# Patient Record
Sex: Female | Born: 1935 | Race: White | Hispanic: No | Smoking: Never smoker
Health system: Southern US, Community
[De-identification: ages and names within clinical notes are randomized; demographics above are authoritative.]

## PROBLEM LIST (undated history)

## (undated) DIAGNOSIS — I428 Other cardiomyopathies: Secondary | ICD-10-CM

## (undated) DIAGNOSIS — C50919 Malignant neoplasm of unspecified site of unspecified female breast: Secondary | ICD-10-CM

## (undated) DIAGNOSIS — G4733 Obstructive sleep apnea (adult) (pediatric): Secondary | ICD-10-CM

## (undated) DIAGNOSIS — E039 Hypothyroidism, unspecified: Secondary | ICD-10-CM

## (undated) DIAGNOSIS — I10 Essential (primary) hypertension: Secondary | ICD-10-CM

## (undated) DIAGNOSIS — E119 Type 2 diabetes mellitus without complications: Secondary | ICD-10-CM

## (undated) DIAGNOSIS — N289 Disorder of kidney and ureter, unspecified: Secondary | ICD-10-CM

## (undated) DIAGNOSIS — I4821 Permanent atrial fibrillation: Secondary | ICD-10-CM

## (undated) HISTORY — DX: Disorder of kidney and ureter, unspecified: N28.9

## (undated) HISTORY — DX: Other cardiomyopathies: I42.8

## (undated) HISTORY — DX: Obstructive sleep apnea (adult) (pediatric): G47.33

## (undated) HISTORY — DX: Hypothyroidism, unspecified: E03.9

## (undated) HISTORY — DX: Essential (primary) hypertension: I10

## (undated) HISTORY — DX: Permanent atrial fibrillation: I48.21

## (undated) HISTORY — DX: Type 2 diabetes mellitus without complications: E11.9

## (undated) HISTORY — PX: PACEMAKER PLACEMENT: SHX43

## (undated) HISTORY — DX: Malignant neoplasm of unspecified site of unspecified female breast: C50.919

---

## 2003-02-26 ENCOUNTER — Ambulatory Visit (HOSPITAL_COMMUNITY): Admission: RE | Admit: 2003-02-26 | Discharge: 2003-02-26 | Payer: Self-pay | Admitting: Cardiology

## 2004-08-10 ENCOUNTER — Ambulatory Visit: Payer: Self-pay | Admitting: Cardiology

## 2004-08-18 ENCOUNTER — Ambulatory Visit: Payer: Self-pay | Admitting: Cardiology

## 2004-10-24 ENCOUNTER — Ambulatory Visit: Payer: Self-pay | Admitting: Cardiology

## 2005-03-15 ENCOUNTER — Ambulatory Visit: Payer: Self-pay | Admitting: Cardiology

## 2005-03-17 ENCOUNTER — Ambulatory Visit: Payer: Self-pay | Admitting: Cardiology

## 2005-03-30 ENCOUNTER — Ambulatory Visit: Payer: Self-pay | Admitting: Cardiology

## 2005-04-28 ENCOUNTER — Ambulatory Visit: Payer: Self-pay | Admitting: Cardiology

## 2006-03-01 ENCOUNTER — Ambulatory Visit: Payer: Self-pay | Admitting: Cardiology

## 2006-03-06 ENCOUNTER — Ambulatory Visit: Payer: Self-pay | Admitting: Cardiology

## 2006-03-06 ENCOUNTER — Inpatient Hospital Stay (HOSPITAL_COMMUNITY): Admission: AD | Admit: 2006-03-06 | Discharge: 2006-03-14 | Payer: Self-pay | Admitting: Cardiology

## 2006-03-08 ENCOUNTER — Encounter: Payer: Self-pay | Admitting: Cardiology

## 2006-03-08 ENCOUNTER — Ambulatory Visit: Payer: Self-pay | Admitting: Internal Medicine

## 2006-03-28 ENCOUNTER — Ambulatory Visit: Payer: Self-pay

## 2006-04-06 ENCOUNTER — Ambulatory Visit: Payer: Self-pay | Admitting: Cardiology

## 2006-04-23 ENCOUNTER — Ambulatory Visit: Payer: Self-pay | Admitting: Cardiology

## 2006-06-19 ENCOUNTER — Ambulatory Visit: Payer: Self-pay | Admitting: Internal Medicine

## 2006-08-09 ENCOUNTER — Ambulatory Visit: Payer: Self-pay | Admitting: Cardiology

## 2006-08-14 ENCOUNTER — Ambulatory Visit: Payer: Self-pay | Admitting: Internal Medicine

## 2006-08-20 ENCOUNTER — Ambulatory Visit: Payer: Self-pay | Admitting: Cardiology

## 2006-08-23 ENCOUNTER — Ambulatory Visit: Payer: Self-pay | Admitting: Cardiology

## 2006-08-28 ENCOUNTER — Inpatient Hospital Stay (HOSPITAL_BASED_OUTPATIENT_CLINIC_OR_DEPARTMENT_OTHER): Admission: RE | Admit: 2006-08-28 | Discharge: 2006-08-28 | Payer: Self-pay | Admitting: Cardiology

## 2006-08-28 ENCOUNTER — Ambulatory Visit: Payer: Self-pay | Admitting: Cardiology

## 2006-08-31 ENCOUNTER — Ambulatory Visit: Payer: Self-pay | Admitting: Cardiology

## 2006-09-03 ENCOUNTER — Ambulatory Visit: Payer: Self-pay | Admitting: Cardiology

## 2006-09-06 ENCOUNTER — Ambulatory Visit: Payer: Self-pay | Admitting: Cardiology

## 2006-09-07 ENCOUNTER — Ambulatory Visit: Payer: Self-pay | Admitting: Cardiology

## 2006-09-13 ENCOUNTER — Ambulatory Visit: Payer: Self-pay | Admitting: Cardiology

## 2006-10-01 ENCOUNTER — Ambulatory Visit: Payer: Self-pay | Admitting: Cardiology

## 2006-10-05 ENCOUNTER — Ambulatory Visit: Payer: Self-pay | Admitting: Cardiology

## 2006-11-06 ENCOUNTER — Ambulatory Visit: Payer: Self-pay | Admitting: Cardiology

## 2006-11-19 ENCOUNTER — Ambulatory Visit: Payer: Self-pay | Admitting: Cardiology

## 2007-02-01 ENCOUNTER — Ambulatory Visit: Payer: Self-pay | Admitting: Cardiology

## 2007-03-22 ENCOUNTER — Ambulatory Visit: Payer: Self-pay | Admitting: Internal Medicine

## 2007-03-27 ENCOUNTER — Ambulatory Visit: Payer: Self-pay | Admitting: Cardiology

## 2007-03-28 ENCOUNTER — Inpatient Hospital Stay (HOSPITAL_COMMUNITY): Admission: AD | Admit: 2007-03-28 | Discharge: 2007-03-30 | Payer: Self-pay | Admitting: Cardiology

## 2007-03-28 ENCOUNTER — Ambulatory Visit: Payer: Self-pay | Admitting: Cardiology

## 2007-04-02 ENCOUNTER — Ambulatory Visit: Payer: Self-pay | Admitting: Cardiology

## 2007-04-05 ENCOUNTER — Ambulatory Visit: Payer: Self-pay | Admitting: Internal Medicine

## 2007-04-09 ENCOUNTER — Ambulatory Visit: Payer: Self-pay | Admitting: Cardiology

## 2007-04-22 ENCOUNTER — Ambulatory Visit: Payer: Self-pay | Admitting: Cardiology

## 2007-05-07 ENCOUNTER — Ambulatory Visit: Payer: Self-pay | Admitting: Cardiology

## 2007-05-22 ENCOUNTER — Ambulatory Visit: Payer: Self-pay | Admitting: Internal Medicine

## 2007-06-04 ENCOUNTER — Ambulatory Visit: Payer: Self-pay | Admitting: Cardiology

## 2007-06-05 ENCOUNTER — Ambulatory Visit: Payer: Self-pay | Admitting: Cardiology

## 2007-06-18 ENCOUNTER — Ambulatory Visit: Payer: Self-pay | Admitting: Cardiology

## 2007-07-01 ENCOUNTER — Ambulatory Visit: Payer: Self-pay | Admitting: Cardiology

## 2007-07-03 ENCOUNTER — Ambulatory Visit: Payer: Self-pay | Admitting: Cardiology

## 2007-07-09 ENCOUNTER — Ambulatory Visit: Payer: Self-pay | Admitting: Cardiology

## 2007-07-16 ENCOUNTER — Ambulatory Visit: Payer: Self-pay | Admitting: Cardiology

## 2007-07-17 ENCOUNTER — Ambulatory Visit: Payer: Self-pay | Admitting: Internal Medicine

## 2007-07-19 ENCOUNTER — Ambulatory Visit: Payer: Self-pay | Admitting: Internal Medicine

## 2007-07-24 ENCOUNTER — Ambulatory Visit: Payer: Self-pay | Admitting: Cardiology

## 2007-08-20 ENCOUNTER — Encounter: Payer: Self-pay | Admitting: Cardiology

## 2007-08-22 ENCOUNTER — Encounter (INDEPENDENT_AMBULATORY_CARE_PROVIDER_SITE_OTHER): Payer: Self-pay | Admitting: Surgery

## 2007-08-22 ENCOUNTER — Ambulatory Visit (HOSPITAL_COMMUNITY): Admission: RE | Admit: 2007-08-22 | Discharge: 2007-08-23 | Payer: Self-pay | Admitting: Surgery

## 2007-09-02 ENCOUNTER — Ambulatory Visit: Payer: Self-pay | Admitting: Cardiology

## 2007-09-11 ENCOUNTER — Ambulatory Visit: Payer: Self-pay | Admitting: Cardiology

## 2007-09-18 ENCOUNTER — Ambulatory Visit: Payer: Self-pay | Admitting: Cardiology

## 2007-10-16 ENCOUNTER — Ambulatory Visit: Payer: Self-pay | Admitting: Internal Medicine

## 2007-11-05 ENCOUNTER — Ambulatory Visit: Payer: Self-pay | Admitting: Cardiology

## 2007-11-13 ENCOUNTER — Ambulatory Visit: Payer: Self-pay | Admitting: Cardiology

## 2008-01-15 ENCOUNTER — Ambulatory Visit: Payer: Self-pay | Admitting: Internal Medicine

## 2008-02-11 ENCOUNTER — Encounter: Payer: Self-pay | Admitting: Cardiology

## 2008-03-06 ENCOUNTER — Ambulatory Visit: Payer: Self-pay | Admitting: Internal Medicine

## 2008-04-15 ENCOUNTER — Ambulatory Visit: Payer: Self-pay | Admitting: Internal Medicine

## 2008-05-19 ENCOUNTER — Encounter: Payer: Self-pay | Admitting: Physician Assistant

## 2008-05-26 ENCOUNTER — Ambulatory Visit: Payer: Self-pay | Admitting: Cardiology

## 2008-07-16 ENCOUNTER — Ambulatory Visit: Payer: Self-pay | Admitting: Internal Medicine

## 2008-07-22 ENCOUNTER — Encounter: Payer: Self-pay | Admitting: Cardiology

## 2008-08-19 ENCOUNTER — Ambulatory Visit: Payer: Self-pay | Admitting: Internal Medicine

## 2008-10-15 ENCOUNTER — Ambulatory Visit: Payer: Self-pay | Admitting: Internal Medicine

## 2008-11-12 ENCOUNTER — Encounter: Payer: Self-pay | Admitting: Cardiology

## 2008-12-19 DIAGNOSIS — E785 Hyperlipidemia, unspecified: Secondary | ICD-10-CM | POA: Insufficient documentation

## 2008-12-19 DIAGNOSIS — G4733 Obstructive sleep apnea (adult) (pediatric): Secondary | ICD-10-CM | POA: Insufficient documentation

## 2008-12-19 DIAGNOSIS — I4821 Permanent atrial fibrillation: Secondary | ICD-10-CM | POA: Insufficient documentation

## 2008-12-19 DIAGNOSIS — N189 Chronic kidney disease, unspecified: Secondary | ICD-10-CM | POA: Insufficient documentation

## 2008-12-19 DIAGNOSIS — J984 Other disorders of lung: Secondary | ICD-10-CM | POA: Insufficient documentation

## 2008-12-19 DIAGNOSIS — I10 Essential (primary) hypertension: Secondary | ICD-10-CM | POA: Insufficient documentation

## 2008-12-19 DIAGNOSIS — I442 Atrioventricular block, complete: Secondary | ICD-10-CM | POA: Insufficient documentation

## 2008-12-19 DIAGNOSIS — E119 Type 2 diabetes mellitus without complications: Secondary | ICD-10-CM | POA: Insufficient documentation

## 2008-12-21 ENCOUNTER — Ambulatory Visit: Payer: Self-pay | Admitting: Cardiology

## 2009-01-16 ENCOUNTER — Encounter: Payer: Self-pay | Admitting: Internal Medicine

## 2009-01-16 ENCOUNTER — Ambulatory Visit: Payer: Self-pay | Admitting: Internal Medicine

## 2009-04-18 ENCOUNTER — Ambulatory Visit: Payer: Self-pay | Admitting: Internal Medicine

## 2009-05-05 ENCOUNTER — Encounter: Payer: Self-pay | Admitting: Internal Medicine

## 2009-05-11 ENCOUNTER — Encounter: Payer: Self-pay | Admitting: Cardiology

## 2009-05-21 ENCOUNTER — Telehealth (INDEPENDENT_AMBULATORY_CARE_PROVIDER_SITE_OTHER): Payer: Self-pay | Admitting: *Deleted

## 2009-05-26 ENCOUNTER — Ambulatory Visit: Payer: Self-pay | Admitting: Cardiology

## 2009-05-26 DIAGNOSIS — I428 Other cardiomyopathies: Secondary | ICD-10-CM | POA: Insufficient documentation

## 2009-05-26 DIAGNOSIS — I2510 Atherosclerotic heart disease of native coronary artery without angina pectoris: Secondary | ICD-10-CM | POA: Insufficient documentation

## 2009-05-28 ENCOUNTER — Ambulatory Visit: Payer: Self-pay | Admitting: Cardiology

## 2009-05-28 ENCOUNTER — Encounter: Payer: Self-pay | Admitting: Cardiology

## 2009-07-14 ENCOUNTER — Ambulatory Visit: Payer: Self-pay | Admitting: Internal Medicine

## 2009-10-04 ENCOUNTER — Ambulatory Visit: Payer: Self-pay | Admitting: Cardiology

## 2009-10-19 ENCOUNTER — Ambulatory Visit: Payer: Self-pay | Admitting: Internal Medicine

## 2009-10-20 ENCOUNTER — Encounter: Payer: Self-pay | Admitting: Cardiology

## 2009-12-16 ENCOUNTER — Telehealth (INDEPENDENT_AMBULATORY_CARE_PROVIDER_SITE_OTHER): Payer: Self-pay | Admitting: *Deleted

## 2009-12-27 ENCOUNTER — Inpatient Hospital Stay (HOSPITAL_COMMUNITY): Admission: RE | Admit: 2009-12-27 | Discharge: 2009-12-30 | Payer: Self-pay | Admitting: Orthopedic Surgery

## 2009-12-29 ENCOUNTER — Telehealth (INDEPENDENT_AMBULATORY_CARE_PROVIDER_SITE_OTHER): Payer: Self-pay | Admitting: *Deleted

## 2010-01-07 ENCOUNTER — Ambulatory Visit: Payer: Self-pay | Admitting: Internal Medicine

## 2010-01-09 IMAGING — CR DG CHEST 2V
2 series · 2 of 2 positions shown · non-contrast
Comparison: 03/08/06.

CLINICAL DATA: Preoperative respiratory exam for right mastectomy.  Pacemaker.  
 CHEST ? 2 VIEW:

[view not recorded (1 of 2)]
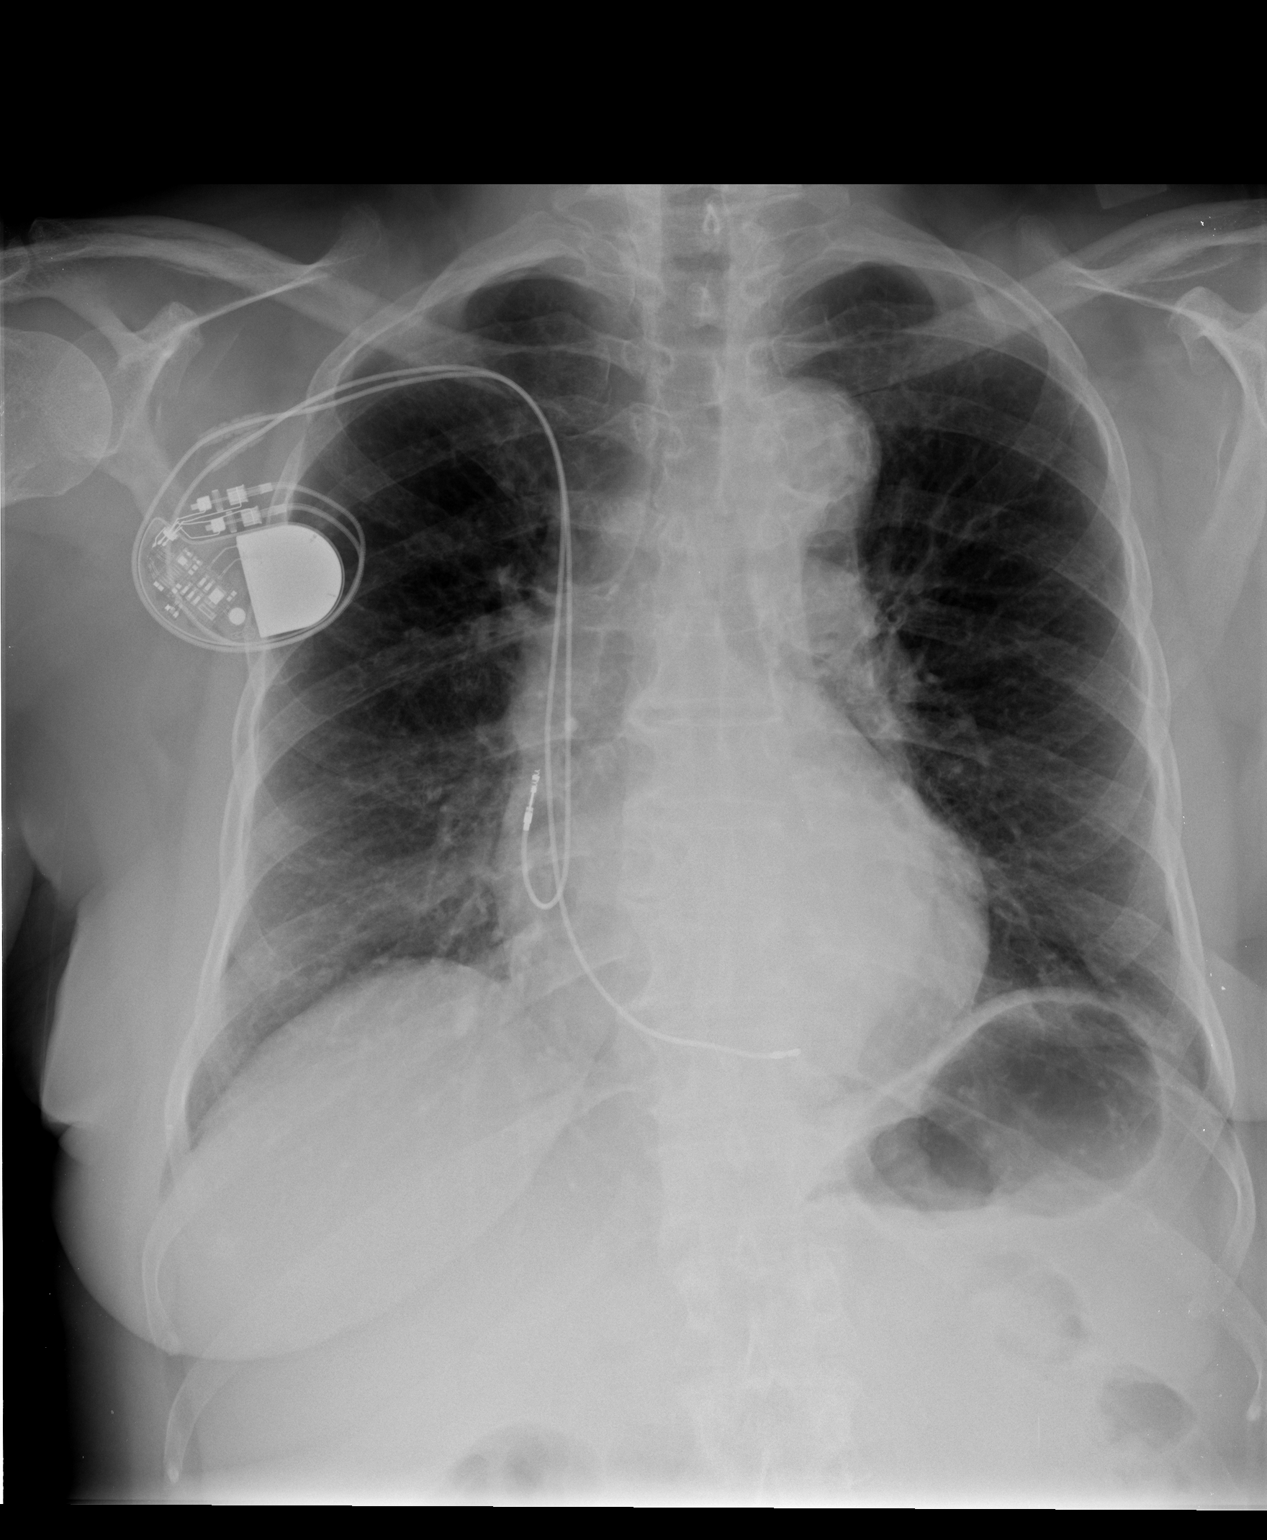

[view not recorded (2 of 2)]
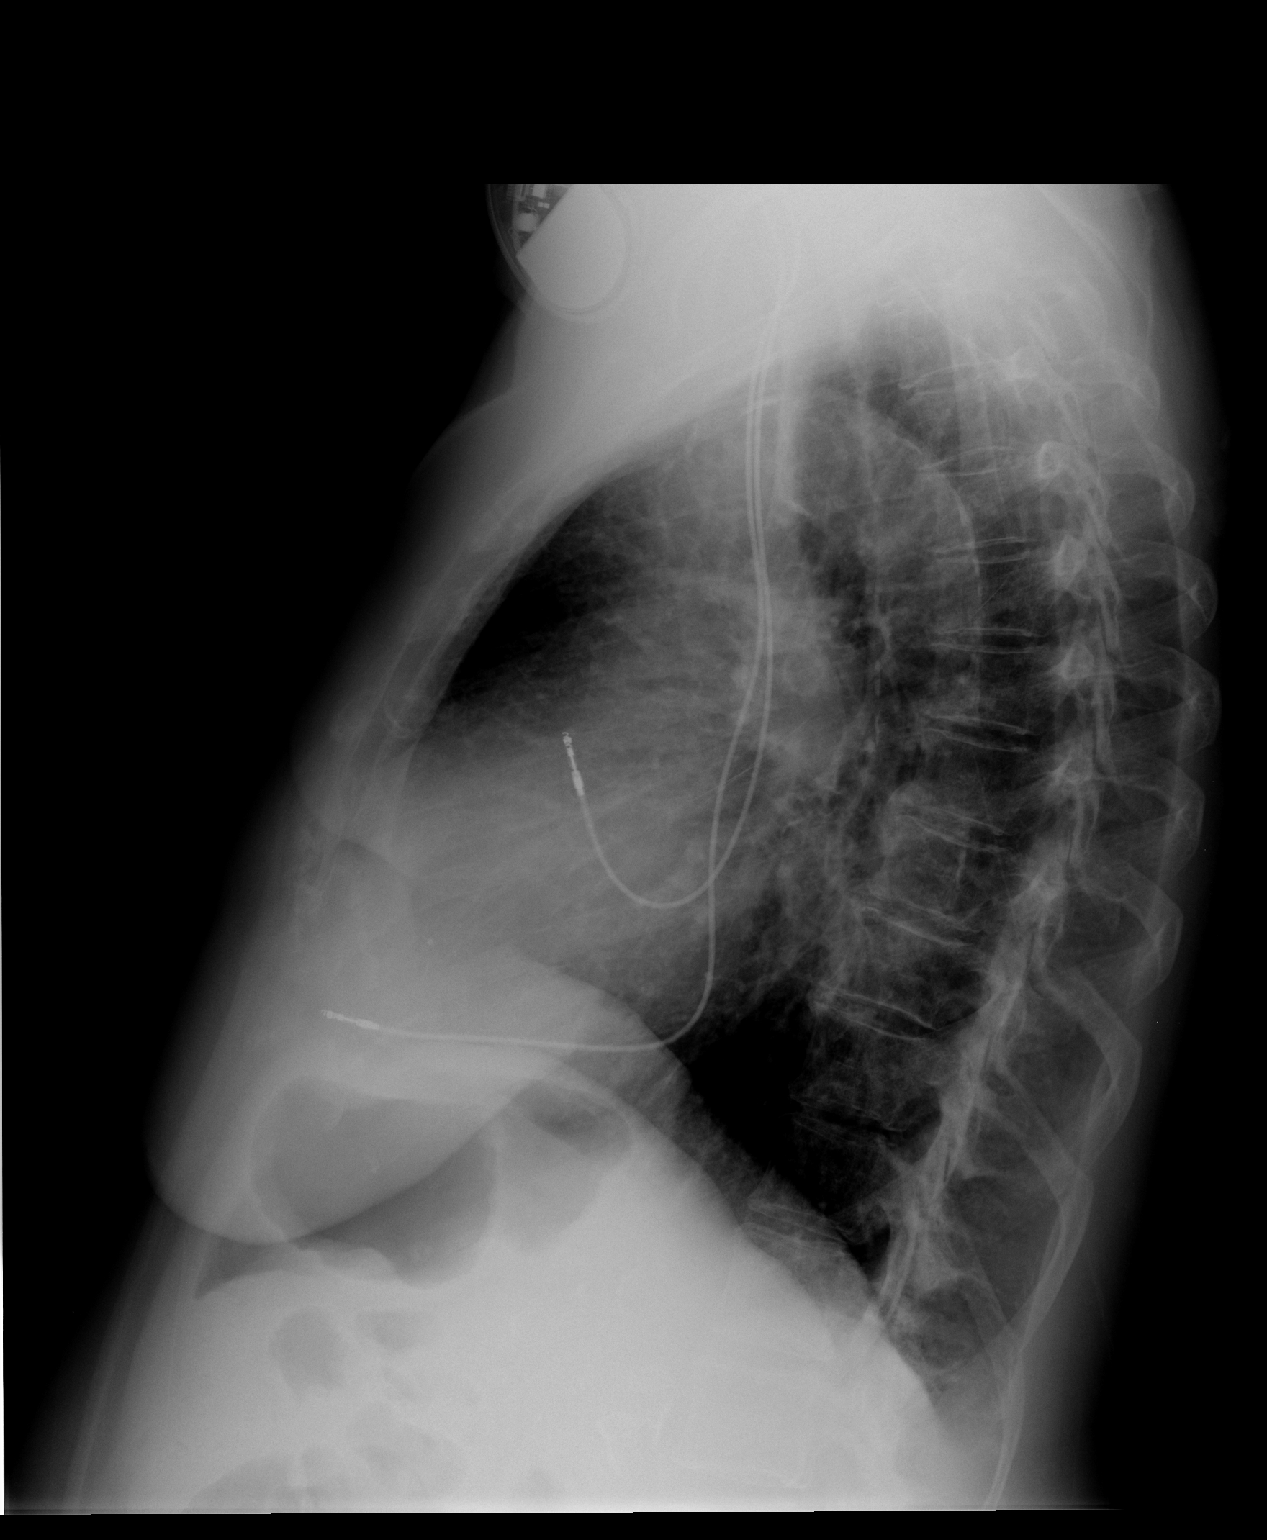

[2 of 2 positions shown; findings below may reference images not displayed]

FINDINGS: The patient has a dual lead pacemaker with leads in the region of the right atrium and right ventricle.  Heart size is normal.  There is calcification and unfolding of the thoracic aorta.  Vascularity is normal.  The lungs are clear.  No effusions.  Ordinary degenerative changes affect the spine.
IMPRESSION: No active disease.

## 2010-01-18 ENCOUNTER — Telehealth (INDEPENDENT_AMBULATORY_CARE_PROVIDER_SITE_OTHER): Payer: Self-pay | Admitting: *Deleted

## 2010-01-31 ENCOUNTER — Encounter (INDEPENDENT_AMBULATORY_CARE_PROVIDER_SITE_OTHER): Payer: Self-pay | Admitting: Critical Care Medicine

## 2010-01-31 ENCOUNTER — Encounter: Payer: Self-pay | Admitting: Physician Assistant

## 2010-01-31 ENCOUNTER — Encounter: Payer: Self-pay | Admitting: Cardiology

## 2010-02-01 ENCOUNTER — Ambulatory Visit: Payer: Self-pay | Admitting: Cardiology

## 2010-02-15 ENCOUNTER — Encounter: Payer: Self-pay | Admitting: Cardiology

## 2010-02-16 ENCOUNTER — Encounter: Payer: Self-pay | Admitting: Cardiology

## 2010-04-17 ENCOUNTER — Ambulatory Visit: Payer: Self-pay | Admitting: Internal Medicine

## 2010-06-24 ENCOUNTER — Ambulatory Visit: Payer: Self-pay | Admitting: Cardiology

## 2010-07-23 ENCOUNTER — Encounter: Payer: Self-pay | Admitting: Internal Medicine

## 2010-08-02 ENCOUNTER — Ambulatory Visit: Payer: Self-pay | Admitting: Internal Medicine

## 2010-08-09 NOTE — Cardiovascular Report (Signed)
Summary: Card Device [facility]/ [facility]  Card Device [facility]/ [facility] SUMMARY   Imported By: Dorise Hiss 07/19/2009 16:22:43  _____________________________________________________________________  External Attachment:    Type:   Image     Comment:   External Document

## 2010-08-09 NOTE — Assessment & Plan Note (Signed)
Summary: Per Dr. Elmarie Mainland needs to have pre op  Medications Added FISH OIL 1000 MG CAPS (OMEGA-3 FATTY ACIDS) Take 2 tablet by mouth two times a day LASIX 20 MG TABS (FUROSEMIDE) Take 1/2 tablet by mouth once a day        Visit Type:  Follow-up Primary Provider:  Kirstie Peri  CC:  follow-up visit and need clearance for knee surgery.  History of Present Illness: the patient is a 77 year old female with a history of nonischemic cardiomyopathy. The patient has an ejection fraction of 40-45%. She has permanent atrial fibrillation. She presents for followup. It sounds like it pacemaker adjustment not to long ago. She reports no symptoms of shortness of breath orthopnea PND. She has no palpitations or syncope. The patient also seen for preoperative evaluation. She reports planning to undergo knee surgery. She denies any chest pain. She has no cardiovascular complaints.   Preventive Screening-Counseling & Management  Alcohol-Tobacco     Smoking Status: never  Current Problems (verified): 1)  Left Ventricular Function, Decreased  (ICD-429.2) 2)  Cardiomyopathy, Dilated  (ICD-425.4) 3)  Coumadin Therapy  (ICD-V58.61) 4)  Pulmonary Nodule  (ICD-518.89) 5)  Obstructive Sleep Apnea  (ICD-327.23) 6)  Dyslipidemia  (ICD-272.4) 7)  Hypertension  (ICD-401.9) 8)  Pacemaker, Permanent  (ICD-V45.01) 9)  Atrial Fibrillation  (ICD-427.31) 10)  Diabetes Mellitus, Type II  (ICD-250.00) 11)  Renal Insufficiency, Chronic  (ICD-585.9)  Current Medications (verified): 1)  Coumadin 5 Mg Tabs (Warfarin Sodium) .... Use As Directed 2)  Alprazolam 0.5 Mg Tabs (Alprazolam) .... Use As Directed 3)  Metformin Hcl 500 Mg Tabs (Metformin Hcl) .... Take 1 Tablet By Mouth Once A Day 4)  Levoxyl 75 Mcg Tabs (Levothyroxine Sodium) .... Take 1 Tablet By Mouth Once A Day 5)  Aspirin 81 Mg Tbec (Aspirin) .... Take 2 Tablet By Mouth Once A Day 6)  Glimepiride 4 Mg Tabs (Glimepiride) .... Take 1 Tablet By Mouth Once A  Day 7)  B Complex  Tabs (B Complex Vitamins) .... Take 1 Tablet By Mouth Once A Day 8)  Fish Oil 1000 Mg Caps (Omega-3 Fatty Acids) .... Take 2 Tablet By Mouth Two Times A Day 9)  Zyrtec Allergy 10 Mg Tabs (Cetirizine Hcl) .... Take 1 Tablet By Mouth Once A Day 10)  Diovan Hct 160-12.5 Mg Tabs (Valsartan-Hydrochlorothiazide) .... Take 1 Tablet By Mouth Once A Day 11)  Lipitor 20 Mg Tabs (Atorvastatin Calcium) .... Take 1 Tablet By Mouth Once A Day 12)  Lasix 20 Mg Tabs (Furosemide) .... Take 1/2 Tablet By Mouth Once A Day  Allergies: 1)  ! Codeine 2)  ! Macrodantin  Comments:  Nurse/Medical Assistant: The patient's medications and allergies were reviewed with the patient and were updated in the Medication and Allergy Lists. List reviewed.  Past History:  Past Medical History: Last updated: 05/26/2009 1. Permanent atrial fibrillation, tachybrady syndrome.     a.     Status post atrioventricular nodal ablation/St. Jude dual      chamber pacemaker implantation. 2. Chronic Coumadin therapy followed by Dr. Sherryll Burger. 3. Nonischemic cardiomyopathy.     a.     Question tachycardia induced.     b.     Ejection fraction 30-40% with global hypokinesis on Nov 15, 2006.     c.     Ejection fraction 40-50% by 2-D echo on November 2008. 4. History of right ventricular pacemaker with lead thrombus.     a.  Negative blood cultures with addition of aspirin to      Coumadin. 5. No embolic events. 6. Hypertension. 7. Type 2 diabetes mellitus. 8. Dyslipidemia. 9. Treated hypothyroidism with normal free T4 and TSH now and chronic     renal insufficiency, obstructive sleep apnea, chronic lower     extremity edema which is nonpitting. 10.History of pulmonary nodule, resolved by chest x-ray in September     2008.  Current Problems:  COUMADIN THERAPY (ICD-V58.61) PULMONARY NODULE (ICD-518.89) OBSTRUCTIVE SLEEP APNEA (ICD-327.23) DYSLIPIDEMIA (ICD-272.4) HYPERTENSION (ICD-401.9) PACEMAKER,  PERMANENT (ICD-V45.01) ATRIAL FIBRILLATION (ICD-427.31) DIABETES MELLITUS, TYPE II (ICD-250.00) RENAL INSUFFICIENCY, CHRONIC (ICD-585.9)    Past Surgical History: Last updated: 12/19/2008  DATE OF PROCEDURE:  08/22/2007   PROCEDURE:   1. Injection of methylene blue, right axillary sentinel lymph node       biopsy (counts of 400, background of 15).   2. Right simple mastectomy.     SURGEON:  Sandria Bales. Ezzard Standing, MD    DATE OF PROCEDURE:  03/29/2007   PROCEDURE PERFORMED:  Atrioventricular node ablation.    ATTENDING:  Doylene Canning. Ladona Ridgel, MD     DATE OF PROCEDURE:  03/07/2006  PROCEDURE:  Dual-chamber pacemaker implantation. PHYSICIAN:  Duke Salvia, MD, [facility]     Family History: Last updated: 05/26/2009 noncontributory  Social History: Last updated: 05/26/2009 the patient doesn't smoke. She is retired.  Risk Factors: Smoking Status: never (10/04/2009)  Review of Systems  The patient denies fatigue, malaise, fever, weight gain/loss, vision loss, decreased hearing, hoarseness, chest pain, palpitations, shortness of breath, prolonged cough, wheezing, sleep apnea, coughing up blood, abdominal pain, blood in stool, nausea, vomiting, diarrhea, heartburn, incontinence, blood in urine, muscle weakness, joint pain, leg swelling, rash, skin lesions, headache, fainting, dizziness, depression, anxiety, enlarged lymph nodes, easy bruising or bleeding, and environmental allergies.    Vital Signs:  Patient profile:   77 year old female Height:      65 inches Weight:      223 pounds Pulse rate:   76 / minute BP sitting:   90 / 64  (right arm) Cuff size:   large  Vitals Entered By: Carlye Grippe (October 04, 2009 10:39 AM) CC: follow-up visit,need clearance for knee surgery   Physical Exam  Additional Exam:  General: Well-developed, well-nourished in no distress head: Normocephalic and atraumatic eyes PERRLA/EOMI intact, conjunctiva and lids normal nose: No deformity or  lesions mouth normal dentition, normal posterior pharynx neck: Supple, no JVD.  No masses, thyromegaly or abnormal cervical nodes Chest: Status post pacemaker implantation lungs: Normal breath sounds bilaterally without wheezing.  Normal percussion heart: regular rate and rhythm with normal S1 and S2, no S3 or S4.  PMI is normal. soft to 2/6 crescendo decrescendo murmur at the right upper sternal border. abdomen: Normal bowel sounds, abdomen is soft and nontender without masses, organomegaly or hernias noted.  No hepatosplenomegaly musculoskeletal: Back normal, normal gait muscle strength and tone normal pulsus: Pulse is normal in all 4 extremities Extremities: 1+ peripheral pitting edema neurologic: Alert and oriented x 3 skin: Intact without lesions or rashes cervical nodes: No significant adenopathy psychologic: Normal affect    EKG  Procedure date:  10/04/2009  Findings:      atrial fibrillation with ventricular pacing heart rate 78 beats per minute  PPM Specifications Following MD:  Sherryl Manges, MD     PPM Vendor:  St Jude     PPM Model Number:  [id number]     PPM Serial Number:  [id number] PPM DOI:  03/07/2006      Lead 1    Location: RA     DOI: 03/07/2006     Model #: 1688TC     Serial #: [id number]     Status: active Lead 2    Location: RV     DOI: 03/07/2006     Model #: 1688TC     Serial #: [id number]     Status: active   Indications:  TACHY/BRADY   PPM Follow Up Pacer Dependent:  Yes      Parameters Mode:  VVIR     Lower Rate Limit:  70     Upper Rate Limit:  110  Impression & Recommendations:  Problem # 1:  CARDIOMYOPATHY, DILATED (ICD-425.4) patient has a nonischemic cardiomyopathy but with significant improvement in her ejection fraction to 50%. The patient has no clinical heart failure symptoms. Her updated medication list for this problem includes:    Coumadin 5 Mg Tabs (Warfarin sodium) ..... Use as directed    Aspirin 81 Mg Tbec (Aspirin) .[name]... Take 2 tablet by  mouth once a day    Diovan Hct 160-12.5 Mg Tabs (Valsartan-hydrochlorothiazide) .[name]... Take 1 tablet by mouth once a day    Lasix 20 Mg Tabs (Furosemide) .[name]... Take 1/2 tablet by mouth once a day  Problem # 2:  PACEMAKER, PERMANENT (ICD-V45.01) EKG was reviewed. Chest stable ventricular pacing.  Problem # 3:  ATRIAL FIBRILLATION (ICD-427.31) the patient is on chronic Coumadin therapy. Her heart rate is well controlled. She has a ventricular paced rhythm. Her updated medication list for this problem includes:    Coumadin 5 Mg Tabs (Warfarin sodium) ..... Use as directed    Aspirin 81 Mg Tbec (Aspirin) .[name]... Take 2 tablet by mouth once a day  Orders: EKG w/ Interpretation (93000)  Problem # 4:  COUMADIN THERAPY (ICD-V58.61)  Problem # 5:  PREOPERATIVE EXAMINATION (ICD-V72.84) the patient should be able to undergo knee surgery. She appears to be at low risk for cardiovascular complications. No further cardiac testing appears indicated.  Patient Instructions: 1)  Your physician recommends that you continue on your current medications as directed. Please refer to the Current Medication list given to you today. 2)  Follow up in  6 months

## 2010-08-09 NOTE — Progress Notes (Signed)
Summary: QUESTION ABOUT LASIX DOSE   Phone Note Call from Patient Call back at [phone]   Caller: Patient Call For: nurse Summary of Call: patient called questioning directions of lasix since her medication profile from [facility] rehab states daily doses of furosemide. Patient states she  was informed by [facility] that she didn't have to take the lasix daily. Nurse informed patient per d/c summary 12/30/09 that MD had signed that it was supposed to be as needed. Patient stated that she wanted to confirm this and verbalized understanding of plan. Initial call taken by: Carlye Grippe,  January 18, 2010 3:36 PM

## 2010-08-09 NOTE — Cardiovascular Report (Signed)
Summary: TTM   TTM   Imported By: Roderic Ovens 10/27/2009 13:02:10  _____________________________________________________________________  External Attachment:    Type:   Image     Comment:   External Document

## 2010-08-09 NOTE — Cardiovascular Report (Signed)
Summary: TTM   TTM   Imported By: Roderic Ovens 04/28/2010 11:32:52  _____________________________________________________________________  External Attachment:    Type:   Image     Comment:   External Document

## 2010-08-09 NOTE — Progress Notes (Signed)
   Phone Note From Other Clinic   Caller: [facility] Initial call taken by: Km    Faxed all Cardiac over to [phone] [facility]  December 16, 2009 10:34 AM

## 2010-08-09 NOTE — Cardiovascular Report (Signed)
Summary: Card Device Clinic/ [facility] SUMMARY  Card Device Clinic/ [facility] SUMMARY   Imported By: Dorise Hiss 01/12/2010 15:28:49  _____________________________________________________________________  External Attachment:    Type:   Image     Comment:   External Document

## 2010-08-09 NOTE — Progress Notes (Signed)
Summary: Pt needs device check but cannot come to the office   Phone Note Call from Patient Call back at Home Phone [phone]   Summary of Call: Pt's dgt, Johnny Bridge, called regarding pt's device check. She is due for device check per reminder. She has been in [facility] hospital d/t orthopedic surgery. They plan to d/c her tomorrow and send to [facility] nursing center for rehab. She is due for device ck in July. Her dgt states she will be at [facility] for rehab for about the next 3-4 wks. She states her mother will not be able to come to the office for this check. She feel her mother does need to be checked soon b/c she is having a smothering feeling. She states her Lasix was adjusted at the hospital but there has been a time in the past when this feeling was relieved after her device was adjusted. Pt's dgt would like to know if someone from the device clinic could come to the nursing center to check device. Notified pt's dgt that this would have to be discussed with the device staff for their approbal. She is aware a note will be sent to the device staff for review and pt's dgt will be notified of plan. Initial call taken by: Cyril Loosen, RN, BSN,  December 29, 2009 4:41 PM  Follow-up for Phone Call        Will go to nursing center next Friday--- please put her on device clinic schedule and put in comments that she is being checked at nursing home. Gypsy Balsam RN BSN  December 29, 2009 4:44 PM

## 2010-08-09 NOTE — Letter (Signed)
Summary: Letter/ SURGERY [facility]  Letter/ [facility]   Imported By: Dorise Hiss 10/20/2009 13:49:40  _____________________________________________________________________  External Attachment:    Type:   Image     Comment:   External Document

## 2010-08-09 NOTE — Assessment & Plan Note (Signed)
Summary: R/S FROM [facility] 10/27 DUE TO BEING SICK-SRS      Allergies Added: ! CODEINE ! MACRODANTIN  Visit Type:  Pacemaker check Primary Provider:  Kirstie Peri  CC:  pacer check.  History of Present Illness: The patient presents today for routine electrophysiology followup. She reports doing very well since last being seen in our clinic. The patient denies symptoms of palpitations, chest pain,orthopnea, PND, dizziness, presyncope, syncope, or neurologic sequela. Her SOB is stable with moderate activity.  Her edema has significantly improved with low dose lasix. The patient is tolerating medications without difficulties and is otherwise without complaint today.   Current Medications (verified): 1)  Coumadin 5 Mg Tabs (Warfarin Sodium) .... Use As Directed 2)  Alprazolam 0.5 Mg Tabs (Alprazolam) .... Use As Directed 3)  Metformin Hcl 500 Mg Tabs (Metformin Hcl) .... Take 1 Tablet By Mouth Once A Day 4)  Levoxyl 75 Mcg Tabs (Levothyroxine Sodium) .... Take 1 Tablet By Mouth Once A Day 5)  Aspirin 81 Mg Tbec (Aspirin) .... Take 2 Tablet By Mouth Once A Day 6)  Glimepiride 4 Mg Tabs (Glimepiride) .... Take 1 Tablet By Mouth Once A Day 7)  B Complex  Tabs (B Complex Vitamins) .... Take 1 Tablet By Mouth Once A Day 8)  Fish Oil 1000 Mg Caps (Omega-3 Fatty Acids) .... Take 1 Tablet By Mouth Once A Day 9)  Zyrtec Allergy 10 Mg Tabs (Cetirizine Hcl) .... Take 1 Tablet By Mouth Once A Day 10)  Diovan Hct 160-12.5 Mg Tabs (Valsartan-Hydrochlorothiazide) .... Take 1 Tablet By Mouth Once A Day 11)  Lipitor 20 Mg Tabs (Atorvastatin Calcium) .... Take 1 Tablet By Mouth Once A Day  Allergies (verified): 1)  ! Codeine 2)  ! Macrodantin  Past History:  Past Medical History: Reviewed history from 05/26/2009 and no changes required. 1. Permanent atrial fibrillation, tachybrady syndrome.     a.     Status post atrioventricular nodal ablation/St. Jude dual      chamber pacemaker implantation. 2. Chronic  Coumadin therapy followed by Dr. Sherryll Burger. 3. Nonischemic cardiomyopathy.     a.     Question tachycardia induced.     b.     Ejection fraction 30-40% with global hypokinesis on Nov 15, 2006.     c.     Ejection fraction 40-50% by 2-D echo on November 2008. 4. History of right ventricular pacemaker with lead thrombus.     a.     Negative blood cultures with addition of aspirin to      Coumadin. 5. No embolic events. 6. Hypertension. 7. Type 2 diabetes mellitus. 8. Dyslipidemia. 9. Treated hypothyroidism with normal free T4 and TSH now and chronic     renal insufficiency, obstructive sleep apnea, chronic lower     extremity edema which is nonpitting. 10.History of pulmonary nodule, resolved by chest x-ray in September     2008.  Current Problems:  COUMADIN THERAPY (ICD-V58.61) PULMONARY NODULE (ICD-518.89) OBSTRUCTIVE SLEEP APNEA (ICD-327.23) DYSLIPIDEMIA (ICD-272.4) HYPERTENSION (ICD-401.9) PACEMAKER, PERMANENT (ICD-V45.01) ATRIAL FIBRILLATION (ICD-427.31) DIABETES MELLITUS, TYPE II (ICD-250.00) RENAL INSUFFICIENCY, CHRONIC (ICD-585.9)    Past Surgical History: Reviewed history from 12/19/2008 and no changes required.  DATE OF PROCEDURE:  08/22/2007   PROCEDURE:   1. Injection of methylene blue, right axillary sentinel lymph node       biopsy (counts of 400, background of 15).   2. Right simple mastectomy.     SURGEON:  Sandria Bales. Ezzard Standing,  MD    DATE OF PROCEDURE:  03/29/2007   PROCEDURE PERFORMED:  Atrioventricular node ablation.    ATTENDING:  Doylene Canning. Ladona Ridgel, MD     DATE OF PROCEDURE:  03/07/2006  PROCEDURE:  Dual-chamber pacemaker implantation. PHYSICIAN:  Duke Salvia, MD, [facility]     Social History: Reviewed history from 05/26/2009 and no changes required. the patient doesn't smoke. She is retired.  Vital Signs:  Patient profile:   77 year old female Height:      65 inches Weight:      236 pounds O2 Sat:      97 % Pulse rate:   84 / minute BP sitting:    137 / 74  (left arm) Cuff size:   large  Vitals Entered By: Carlye Grippe (July 14, 2009 3:42 PM) CC: pacer check   Physical Exam  General:  Obese, NAD Head:  normocephalic and atraumatic Mouth:  Teeth, gums and palate normal. Oral mucosa normal. Neck:  JVP 8cm Chest Wall:  pacemaker site is well healed Lungs:  Clear bilaterally to auscultation and percussion. Heart:  RRR (paced), no m/r/g Abdomen:  Bowel sounds positive; abdomen soft and non-tender without masses, organomegaly, or hernias noted. No hepatosplenomegaly. Msk:  Back normal, normal gait. Muscle strength and tone normal. Pulses:  pulses normal in all 4 extremities Extremities:  No clubbing or cyanosis. Neurologic:  Alert and oriented x 3.   PPM Specifications Following MD:  Sherryl Manges, MD     PPM Vendor:  St Jude     PPM Model Number:  [id number]     PPM Serial Number:  [id number] PPM DOI:  03/07/2006      Lead 1    Location: RA     DOI: 03/07/2006     Model #: 1688TC     Serial #: [id number]     Status: active Lead 2    Location: RV     DOI: 03/07/2006     Model #: 1688TC     Serial #: [id number]     Status: active   Indications:  TACHY/BRADY   PPM Follow Up Remote Check?  No Battery Voltage:  2.76 V     Battery Est. Longevity:  8.5 YEARS     Pacer Dependent:  Yes     Right Ventricle  Amplitude: PACED AT 30 mV, Impedance: 389 ohms, Threshold: 0.875 V at 0.4 msec  Episodes Ventricular High Rate:  NOT AVAILABLE     Ventricular Pacing:  >99%  Parameters Mode:  VVIR     Lower Rate Limit:  70     Upper Rate Limit:  110 Next Cardiology Appt Due:  07/11/2010 Tech Comments:  Normal device function.  Rate response adjusted.  No other changes made.  Pt does TTM's with [employer].  ROV 12 months Dr Johney Frame. Gypsy Balsam RN BSN  July 14, 2009 3:38 PM  MD Comments:  agree with above  Impression & Recommendations:  Problem # 1:  ATRIAL FIBRILLATION (ICD-427.31) Rate controlled Continue coumadin  Her updated medication list  for this problem includes:    Coumadin 5 Mg Tabs (Warfarin sodium) ..... Use as directed    Aspirin 81 Mg Tbec (Aspirin) .[name]... Take 2 tablet by mouth once a day  Problem # 2:  PACEMAKER, PERMANENT (ICD-V45.01) Normal pacemaker function for bradycardia. No changes today  Problem # 3:  LEFT VENTRICULAR FUNCTION, DECREASED (ICD-429.2) stable continue current medical therapy Recent echo reviewed  Patient Instructions: 1)  return in  12 months 2)  continue TTMs every 3 months

## 2010-08-09 NOTE — Procedures (Addendum)
Summary: DEVICE CHECK AT NURSING HOME PER AMBER    Allergies: 1)  ! Codeine 2)  ! Macrodantin  Primary Provider:  Kirstie Peri   History of Present Illness: The patient presents today for routine electrophysiology followup. She reports doing reasonbaly well. The patient denies symptoms of palpitations, chest pain, shortness of breath, orthopnea, PND, lower extremity edema, dizziness, presyncope, syncope, or neurologic sequela. She reports having hypotension recently and therefore has stopped diovan.  The patient is tolerating other medications without difficulties and is otherwise without complaint today.    Past History:  Past Medical History: Reviewed history from 05/26/2009 and no changes required. 1. Permanent atrial fibrillation, tachybrady syndrome.     a.     Status post atrioventricular nodal ablation/St. Jude dual      chamber pacemaker implantation. 2. Chronic Coumadin therapy followed by Dr. Sherryll Burger. 3. Nonischemic cardiomyopathy.     a.     Question tachycardia induced.     b.     Ejection fraction 30-40% with global hypokinesis on Nov 15, 2006.     c.     Ejection fraction 40-50% by 2-D echo on November 2008. 4. History of right ventricular pacemaker with lead thrombus.     a.     Negative blood cultures with addition of aspirin to      Coumadin. 5. No embolic events. 6. Hypertension. 7. Type 2 diabetes mellitus. 8. Dyslipidemia. 9. Treated hypothyroidism with normal free T4 and TSH now and chronic     renal insufficiency, obstructive sleep apnea, chronic lower     extremity edema which is nonpitting. 10.History of pulmonary nodule, resolved by chest x-ray in September     2008.  Current Problems:  COUMADIN THERAPY (ICD-V58.61) PULMONARY NODULE (ICD-518.89) OBSTRUCTIVE SLEEP APNEA (ICD-327.23) DYSLIPIDEMIA (ICD-272.4) HYPERTENSION (ICD-401.9) PACEMAKER, PERMANENT (ICD-V45.01) ATRIAL FIBRILLATION (ICD-427.31) DIABETES MELLITUS, TYPE II (ICD-250.00) RENAL  INSUFFICIENCY, CHRONIC (ICD-585.9)    Past Surgical History: Reviewed history from 12/19/2008 and no changes required.  DATE OF PROCEDURE:  08/22/2007   PROCEDURE:   1. Injection of methylene blue, right axillary sentinel lymph node       biopsy (counts of 400, background of 15).   2. Right simple mastectomy.     SURGEON:  Sandria Bales. Ezzard Standing, MD    DATE OF PROCEDURE:  03/29/2007   PROCEDURE PERFORMED:  Atrioventricular node ablation.    ATTENDING:  Doylene Canning. Ladona Ridgel, MD     DATE OF PROCEDURE:  03/07/2006  PROCEDURE:  Dual-chamber pacemaker implantation. PHYSICIAN:  Duke Salvia, MD, [facility]      Physical Exam  General:  NAD, in a wheelchair today Head:  normocephalic and atraumatic Eyes:  PERRLA/EOM intact; conjunctiva and lids normal. Mouth:  Teeth, gums and palate normal. Oral mucosa normal. Neck:  suppe Chest Wall:  pacemaker pocket is well healed Lungs:  Clear bilaterally to auscultation and percussion. Heart:  RRR (paced) Abdomen:  Bowel sounds positive; abdomen soft and non-tender without masses, organomegaly, or hernias noted. No hepatosplenomegaly. Msk:  Back normal, normal gait. Muscle strength and tone normal. Pulses:  pulses normal in all 4 extremities Extremities:  No clubbing or cyanosis. Neurologic:  Alert and oriented x 3. Skin:  Intact without lesions or rashes.   Impression & Recommendations:  Problem # 1:  ATRIAL FIBRILLATION (ICD-427.31) stable continue coumadin  Problem # 2:  PACEMAKER, PERMANENT (ICD-V45.01) Normal pacemaker function no changes today  Problem # 3:  HYPERTENSION (ICD-401.9) recent episodes of hypotension diovan  is on hold   Patient Instructions: 1)  return in 6 months   PPM Specifications Following MD:  Sherryl Manges, MD     PPM Vendor:  St Jude     PPM Model Number:  [id number]     PPM Serial Number:  [id number] PPM DOI:  03/07/2006      Lead 1    Location: RA     DOI: 03/07/2006     Model #: [id number]     Serial #: [id number]      Status: active Lead 2    Location: RV     DOI: 03/07/2006     Model #: 1688TC     Serial #: [id number]     Status: active   Indications:  TACHY/BRADY   PPM Follow Up Remote Check?  No Battery Voltage:  2.76 V     Battery Est. Longevity:  7.25 years     Pacer Dependent:  Yes     Right Ventricle  Impedance: 376 ohms, Threshold: 0.875 V at 1.5 msec  Episodes Ventricular Pacing:  >99%  Parameters Mode:  VVIR     Lower Rate Limit:  70     Upper Rate Limit:  110 MD Comments:  permanent afib,  falt histogram

## 2010-08-15 ENCOUNTER — Telehealth (INDEPENDENT_AMBULATORY_CARE_PROVIDER_SITE_OTHER): Payer: Self-pay | Admitting: *Deleted

## 2010-08-17 NOTE — Cardiovascular Report (Signed)
Summary: TTM   TTM   Imported By: Roderic Ovens 08/10/2010 16:12:24  _____________________________________________________________________  External Attachment:    Type:   Image     Comment:   External Document

## 2010-08-25 NOTE — Progress Notes (Signed)
Summary: WANT EARLY APPT FOR BLURRED VISION   Phone Note Call from Patient Call back at Home Phone [phone]   Caller: Patient Call For: NURSE Summary of Call: message left on nurse voicmail @11 :28am that patient needed an urgent appt. Nurse returned called and patient told nurse that she needed to be seen for blurred vision and she was already seeing PCP,eye doctor for this problem but felt MD could figure out what was going on. Patient stated that she was dx with diabetes and that this was the cause of the problem. Nurse explained to patient that March 12th was the earliest appt we had and she was seeing appropriate MD for her blurred vision. Initial call taken by: Carlye Grippe,  August 15, 2010 2:19 PM

## 2010-09-19 ENCOUNTER — Ambulatory Visit (INDEPENDENT_AMBULATORY_CARE_PROVIDER_SITE_OTHER): Payer: Medicare Other | Admitting: Cardiology

## 2010-09-19 ENCOUNTER — Encounter: Payer: Self-pay | Admitting: Cardiology

## 2010-09-19 DIAGNOSIS — I4891 Unspecified atrial fibrillation: Secondary | ICD-10-CM

## 2010-09-19 DIAGNOSIS — R0602 Shortness of breath: Secondary | ICD-10-CM

## 2010-09-22 ENCOUNTER — Encounter: Payer: Self-pay | Admitting: Cardiology

## 2010-09-22 DIAGNOSIS — R0602 Shortness of breath: Secondary | ICD-10-CM

## 2010-09-25 LAB — ABO/RH: ABO/RH(D): O POS

## 2010-09-25 LAB — BASIC METABOLIC PANEL
BUN: 12 mg/dL (ref 6–23)
CO2: 30 mEq/L (ref 19–32)
Calcium: 8.3 mg/dL — ABNORMAL LOW (ref 8.4–10.5)
GFR calc non Af Amer: 47 mL/min — ABNORMAL LOW (ref >60)
GFR calc non Af Amer: 48 mL/min — ABNORMAL LOW (ref >60)
Glucose, Bld: 147 mg/dL — ABNORMAL HIGH (ref 70–99)
Glucose, Bld: 149 mg/dL — ABNORMAL HIGH (ref 70–99)
Potassium: 3.8 mEq/L (ref 3.5–5.1)
Potassium: 4.1 mEq/L (ref 3.5–5.1)
Sodium: 139 mEq/L (ref 135–145)

## 2010-09-25 LAB — TYPE AND SCREEN
ABO/RH(D): O POS
Antibody Screen: NEGATIVE

## 2010-09-25 LAB — GLUCOSE, CAPILLARY
Glucose-Capillary: 105 mg/dL — ABNORMAL HIGH (ref 70–99)
Glucose-Capillary: 107 mg/dL — ABNORMAL HIGH (ref 70–99)
Glucose-Capillary: 124 mg/dL — ABNORMAL HIGH (ref 70–99)
Glucose-Capillary: 144 mg/dL — ABNORMAL HIGH (ref 70–99)
Glucose-Capillary: 186 mg/dL — ABNORMAL HIGH (ref 70–99)

## 2010-09-25 LAB — CBC
HCT: 37.3 % (ref 36.0–46.0)
Hemoglobin: 12.3 g/dL (ref 12.0–15.0)
MCHC: 31.8 g/dL (ref 30.0–36.0)
MCHC: 31.8 g/dL (ref 30.0–36.0)
MCHC: 32.2 g/dL (ref 30.0–36.0)
MCV: 93.9 fL (ref 78.0–100.0)
Platelets: 159 10*3/uL (ref 150–400)
RBC: 4.05 MIL/uL (ref 3.87–5.11)
RDW: 15.2 % (ref 11.5–15.5)

## 2010-09-25 LAB — PROTIME-INR
INR: 1.15 (ref 0.00–1.49)
INR: 1.35 (ref 0.00–1.49)
Prothrombin Time: 14.6 seconds (ref 11.6–15.2)

## 2010-09-26 LAB — URINALYSIS, ROUTINE W REFLEX MICROSCOPIC
Glucose, UA: NEGATIVE mg/dL
Hgb urine dipstick: NEGATIVE
Protein, ur: NEGATIVE mg/dL

## 2010-09-26 LAB — COMPREHENSIVE METABOLIC PANEL
ALT: 18 U/L (ref 0–35)
Alkaline Phosphatase: 74 U/L (ref 39–117)
CO2: 27 mEq/L (ref 19–32)
GFR calc non Af Amer: 51 mL/min — ABNORMAL LOW (ref >60)
Glucose, Bld: 112 mg/dL — ABNORMAL HIGH (ref 70–99)
Potassium: 3.8 mEq/L (ref 3.5–5.1)
Sodium: 140 mEq/L (ref 135–145)
Total Protein: 7.7 g/dL (ref 6.0–8.3)

## 2010-09-26 LAB — CBC
Hemoglobin: 15.4 g/dL — ABNORMAL HIGH (ref 12.0–15.0)
RBC: 5.05 MIL/uL (ref 3.87–5.11)
WBC: 8.5 10*3/uL (ref 4.0–10.5)

## 2010-09-26 LAB — URINE MICROSCOPIC-ADD ON

## 2010-09-26 LAB — PROTIME-INR: Prothrombin Time: 21.9 seconds — ABNORMAL HIGH (ref 11.6–15.2)

## 2010-09-26 LAB — SURGICAL PCR SCREEN: Staphylococcus aureus: NEGATIVE

## 2010-09-27 NOTE — Medication Information (Signed)
Summary: [facility] D/C MEDICATION SHEET ORDER  [facility] D/C MEDICATION SHEET ORDER   Imported By: Zachary George 09/19/2010 13:31:51  _____________________________________________________________________  External Attachment:    Type:   Image     Comment:   External Document

## 2010-09-27 NOTE — Medication Information (Signed)
Summary: [facility] D/C MEDICATION SHEET ORDER  [facility] D/C MEDICATION SHEET ORDER   Imported By: Zachary George 09/19/2010 13:45:38  _____________________________________________________________________  External Attachment:    Type:   Image     Comment:   External Document

## 2010-09-27 NOTE — Letter (Signed)
Summary: [facility] D[facility]C DR. [facility]  [facility] D[facility]C DR. [facility]   Imported By: Zachary George 09/19/2010 13:36:49  _____________________________________________________________________  External Attachment:    Type:   Image     Comment:   External Document

## 2010-09-27 NOTE — Letter (Signed)
Summary: [facility] D[facility]C DR. [facility]  [facility] D[facility]C DR. [facility]   Imported By: Zachary George 09/19/2010 13:36:17  _____________________________________________________________________  External Attachment:    Type:   Image     Comment:   External Document

## 2010-09-27 NOTE — Consult Note (Signed)
Summary: CARDIOLOGY CONSULT/ [facility]  CARDIOLOGY CONSULT/ [facility]   Imported By: Zachary George 09/19/2010 13:31:12  _____________________________________________________________________  External Attachment:    Type:   Image     Comment:   External Document

## 2010-09-29 ENCOUNTER — Encounter: Payer: Self-pay | Admitting: *Deleted

## 2010-10-06 NOTE — Assessment & Plan Note (Signed)
Summary: 6 month fu-vs    Visit Type:  Follow-up Primary Provider:  Kirstie Peri   History of Present Illness: The patient has a recent history of angioedema.  She was last seen by our group in July of 2011.  Chest some nausea and diaphoresis and didn't feel right it appeared that the patient had an episode of orthostatic hypotension as well as vasovagal syndrome.  She was dehydrated. The patient has atrial  fibrillation status post AV nodal ablation and pacemaker implantation. She has normal LV function as determined by echocardiogram July 2010.[name]  Previously the patient has a nonischemic cardiomyopathy.  She is on chronic Coumadin therapy and is remained therapeutic.  She also status post total knee replacement.  The patient has permanent atrial fibrillation She has a history of RV lead pacemaker lead thrombus but with negative blood cultures and aspirin was added to Coumadin.  She's had no further complications. The patient is doing well.  She denies any chest pain or shortness of breath.  She has no palpitations.  She does have some problems with her gait is somewhat unsteady.  She always walks with a cane.  She also has strabismus and is wearing an eye patch. The patient has requested whether she needs supplements.  I told her that she could benefit from calcium and vitamin D.   Preventive Screening-Counseling & Management  Alcohol-Tobacco     Smoking Status: never  Allergies: 1)  ! Codeine 2)  ! Macrodantin  Past History:  Past Medical History: Last updated: 05/26/2009 1. Permanent atrial fibrillation, tachybrady syndrome.     a.     Status post atrioventricular nodal ablation/St. Jude dual      chamber pacemaker implantation. 2. Chronic Coumadin therapy followed by Dr. Sherryll Burger. 3. Nonischemic cardiomyopathy.     a.     Question tachycardia induced.     b.     Ejection fraction 30-40% with global hypokinesis on Nov 15, 2006.     c.     Ejection fraction 40-50% by 2-D echo  on November 2008. 4. History of right ventricular pacemaker with lead thrombus.     a.     Negative blood cultures with addition of aspirin to      Coumadin. 5. No embolic events. 6. Hypertension. 7. Type 2 diabetes mellitus. 8. Dyslipidemia. 9. Treated hypothyroidism with normal free T4 and TSH now and chronic     renal insufficiency, obstructive sleep apnea, chronic lower     extremity edema which is nonpitting. 10.History of pulmonary nodule, resolved by chest x-ray in September     2008.  Current Problems:  COUMADIN THERAPY (ICD-V58.61) PULMONARY NODULE (ICD-518.89) OBSTRUCTIVE SLEEP APNEA (ICD-327.23) DYSLIPIDEMIA (ICD-272.4) HYPERTENSION (ICD-401.9) PACEMAKER, PERMANENT (ICD-V45.01) ATRIAL FIBRILLATION (ICD-427.31) DIABETES MELLITUS, TYPE II (ICD-250.00) RENAL INSUFFICIENCY, CHRONIC (ICD-585.9)    Past Surgical History: Last updated: 12/19/2008  DATE OF PROCEDURE:  08/22/2007   PROCEDURE:   1. Injection of methylene blue, right axillary sentinel lymph node       biopsy (counts of 400, background of 15).   2. Right simple mastectomy.     SURGEON:  Sandria Bales. Ezzard Standing, MD    DATE OF PROCEDURE:  03/29/2007   PROCEDURE PERFORMED:  Atrioventricular node ablation.    ATTENDING:  Doylene Canning. Ladona Ridgel, MD     DATE OF PROCEDURE:  03/07/2006  PROCEDURE:  Dual-chamber pacemaker implantation. PHYSICIAN:  Duke Salvia, MD, [facility]     Family History: Last updated: 05/26/2009  noncontributory  Social History: Last updated: 05/26/2009 the patient doesn't smoke. She is retired.  Risk Factors: Smoking Status: never (09/19/2010)  Review of Systems  The patient denies fatigue, malaise, fever, weight gain/loss, vision loss, decreased hearing, hoarseness, chest pain, palpitations, shortness of breath, prolonged cough, wheezing, sleep apnea, coughing up blood, abdominal pain, blood in stool, nausea, vomiting, diarrhea, heartburn, incontinence, blood in urine, muscle weakness, joint  pain, leg swelling, rash, skin lesions, headache, fainting, dizziness, depression, anxiety, enlarged lymph nodes, easy bruising or bleeding, and environmental allergies.    Vital Signs:  Patient profile:   77 year old female Height:      65 inches Weight:      210 pounds BMI:     35.07 Pulse rate:   78 / minute BP sitting:   138 / 86  (left arm) Cuff size:   large  Vitals Entered By: Carlye Grippe (September 19, 2010 3:03 PM)  Nutrition Counseling: Patient's BMI is greater than 25 and therefore counseled on weight management options.  Physical Exam  Additional Exam:  General: Well-developed, well-nourished in no distress head: Normocephalic and atraumatic eyes PERRLA/EOMI intact, conjunctiva and lids normal nose: No deformity or lesions mouth normal dentition, normal posterior pharynx neck: Supple, no JVD.  No masses, thyromegaly or abnormal cervical nodes Chest: Status post pacemaker implantation lungs: Normal breath sounds bilaterally without wheezing.  Normal percussion heart: regular rate and rhythm with normal S1 and S2, no S3 or S4.  PMI is normal. soft to 2/6 crescendo decrescendo murmur at the right upper sternal border. abdomen: Normal bowel sounds, abdomen is soft and nontender without masses, organomegaly or hernias noted.  No hepatosplenomegaly musculoskeletal: Back normal, normal gait muscle strength and tone normal pulsus: Pulse is normal in all 4 extremities Extremities: 1+ peripheral pitting edema neurologic: Alert and oriented x 3 skin: Intact without lesions or rashes cervical nodes: No significant adenopathy psychologic: Normal affect    PPM Specifications Following MD:  Sherryl Manges, MD     PPM Vendor:  St Jude     PPM Model Number:  [id number]     PPM Serial Number:  [id number] PPM DOI:  03/07/2006      Lead 1    Location: RA     DOI: 03/07/2006     Model #: 1688TC     Serial #: [id number]     Status: active Lead 2    Location: RV     DOI: 03/07/2006     Model #:  1688TC     Serial #: [id number]     Status: active   Indications:  TACHY/BRADY   PPM Follow Up Pacer Dependent:  Yes      Parameters Mode:  VVIR     Lower Rate Limit:  70     Upper Rate Limit:  110  Impression & Recommendations:  Problem # 1:  PACEMAKER, PERMANENT (ICD-V45.01) status post dual-chamber pacemaker: Pacemaker is in the VVIR mode status post AV nodal ablation  Problem # 2:  COUMADIN THERAPY (ICD-V58.61) Coumadin therapy: Followed by her primary care physician history of RV lead thrombus  Problem # 3:  ATRIAL FIBRILLATION (ICD-427.31) permanent fibrillation status post AV nodal ablation  Her updated medication list for this problem includes:    Coumadin 5 Mg Tabs (Warfarin sodium) ..... Use as directed    Aspirin 81 Mg Tbec (Aspirin) .[name]... Take 2 tablet by mouth once a day  Problem # 4:  LEFT VENTRICULAR FUNCTION, DECREASED (ICD-429.2) history of nonischemic cardiomyopathy  with normal his ejection fraction Orders: 2-D Echocardiogram (2D Echo)  Patient Instructions: 1)  Your physician wants you to follow-up in: 6 months. You will receive a reminder letter in the mail one-two months in advance. If you don't receive a letter, please call our office to schedule the follow-up appointment. 2)  Your physician has requested that you have an echocardiogram.  Echocardiography is a painless test that uses sound waves to create images of your heart. It provides your doctor with information about the size and shape of your heart and how well your heart's chambers and valves are working.  This procedure takes approximately one hour. There are no restrictions for this procedure. If the results of your test are normal or stable, you will receive a letter. If they are abnormal, the nurse will contact you by phone.  3)  Your physician recommends that you continue on your current medications as directed. Please refer to the Current Medication list given to you today.

## 2010-10-06 NOTE — Letter (Signed)
Summary: Engineer, materials at [facility]  [address]   [place], [place] [address]   Phone: [phone]  Fax: [phone]        September 29, 2010 MRN: [id number]   [name] [name] [address] [place], [place]  [address]   Dear [name],  Your test ordered by Selena Batten has been reviewed by your physician (or physician assistant) and was found to be normal or stable. Your physician (or physician assistant) felt no changes were needed at this time.  __X__ Echocardiogram  ____ Cardiac Stress Test  ____ Lab Work  ____ Peripheral vascular study of arms, legs or neck  ____ CT scan or X-ray  ____ Lung or Breathing test  ____ Other:   Thank you.   Hoover Brunette, LPN    Duane Boston, M.D., F.A.C.C. Thressa Sheller, M.D., F.A.C.C. Oneal Grout, M.D., F.A.C.C. Cheree Ditto, M.D., F.A.C.C. Daiva Nakayama, M.D., F.A.C.C. Kenney Houseman, M.D., F.A.C.C. Jeanne Ivan, PA-C

## 2010-10-25 ENCOUNTER — Encounter: Payer: Self-pay | Admitting: Internal Medicine

## 2010-10-25 DIAGNOSIS — I495 Sick sinus syndrome: Secondary | ICD-10-CM

## 2010-11-22 NOTE — Cardiovascular Report (Signed)
[facility]                   [place] ELECTROPHYSIOLOGY DEVICE CLINIC NOTE   ZILAH, VILLAFLOR                   MRN:          [id number]  DATE:07/19/2007                            DOB:          1929/04/13    Mrs. Mires is now about 3 months status post AV junction ablation for  uncontrolled atrial fibrillation and inadequate rate control.  She is  feeling much better.  Unfortunately, she has learned by repeat  mammography that there is a highly suspicious area in her right breast;  I should note that she is status post left mastectomy already.  She is  to undergo biopsy on the January 20th and then anticipates pursuing  mastectomy and would like this done in [place].   She has a modest amount of ischemic cardiomyopathy and is having no  significant heart failure at this point.  Her ejection fraction has not  been reassessed following adequate reining of her heart rate.   MEDICATIONS:  Metformin, Levoxyl, lovastatin, tamoxifen, aspirin,  glimepiride, and Coumadin.   PHYSICAL EXAMINATION:  VITAL SIGNS:  Her blood pressure today was  159/97.  LUNGS:  Clear.  HEART:  Sounds were regular.  The pacemaker pocket was well healed and  it is on the right.  EXTREMITIES:  Without edema.  SKIN:  Warm and dry.   Interrogation of her St. Jude [employer] demonstrates a  paced ventricular rate at 30 with impedance of 344, a threshold of 1  volt at 0.5.   IMPRESSION:  1. Atrial fibrillation with previously uncontrolled ventricular      response, status post AV junction ablation.  2. Status post pacer for the above.  3. Nonischemic myopathy, question tachycardia, question resolved.  4. Status post left mastectomy now with suspicious lesion in the right      breast with anticipated biopsy.  5. Diabetes.  6. Hypertension.   Mrs. Bedonie is doing well from a cardiac point-of-view.  It will be  valuable to reassess her LV function which we  will do down the road.  Unfortunately, she is concerned that the biopsies will be malignant and  she would want to undergo breast removal surgery in [facility] and we  will attempt to facilitate contact with a surgeon.   Her other issue is poorly controlled hypertension.  She is not on an ACE  inhibitor and with her diabetes this should be reconsidered.  I will  have to go back and look.  She was on Lisinopril  about 6 or 8 months ago and it was discontinued for reasons that are not  clear.  I will defer this to Dr. Sherryll Burger.     Duke Salvia, MD, [facility]  Electronically Signed    SCK/MedQ  DD: 07/19/2007  DT: 07/19/2007  Job #: [id number]   cc:   Kirstie Peri, MD

## 2010-11-22 NOTE — Discharge Summary (Signed)
[name] [name], [name] [name]            ACCOUNT NO.:  [id number]   MEDICAL RECORD NO.:  [id number]          PATIENT TYPE:  INP   LOCATION:  4733                         FACILITY:  [facility]   PHYSICIAN:  [name], MD,FACC DATE OF BIRTH:  1928-11-19   DATE OF ADMISSION:  03/28/2007  DATE OF DISCHARGE:  03/30/2007                               DISCHARGE SUMMARY   FINAL DIAGNOSES:  1. Discharging day #1 status post electrophysiology study and      radiofrequency catheter ablation of the atrioventricular node.  2. Pacemaker is at VVI, 90 beats per minute   SECONDARY DIAGNOSES:  1. Admitted to [facility] with presyncope.      a.     Atrial fibrillation, rapid ventricular rate, with difficult       rate control.      b.     Cartia increased from 120 to 240 mg daily 1 week ago.  2. Decline in ejection fraction over a one-year period from 55% to      35%, indicating possible tachycardia-mediated cardiomyopathy.  3. This patient has failed antiarrhythmic therapy in the past, both      Tikosyn for long QT syndrome and amiodarone for pulmonary fibrosis.  4. Anorexia, weight loss of 45 pounds since November 2007.  5. Diabetes.  6. Hypertension.  7. Renal insufficiency.  8. Gout.  9. Obstructive sleep apnea/continuous positive airway pressure.  10.Treated hypothyroidism.  11.History of breast cancer, status post left mastectomy.  12.New York Heart Association class II chronic systolic congestive      heart failure.  Catheterization in February 2008.  Ejection      fraction 35%.  The left main is free of disease.  Left anterior      descending 50% midpoint after the first septal perforator,  40%      stenosis. Left circumflex has luminal irregularities.  First obtuse      marginal is normal.  The right coronary artery had a proximal 25%      stenosis.  The posterior descending artery is normal.   PROCEDURE:  March 28, 2007, radiofrequency catheter ablation of AV  node, Dr. Sherryl Manges.   BRIEF HISTORY:  Ms. [place] is a 77 year old female admitted from [facility].  She has a history of permanent atrial fibrillation.  She has  failed both Tikosyn and amiodarone in the past.  She had a pacemaker  implanted on March 07, 2006 for tachybrady syndrome with pauses up to  4.5 seconds.  It is [employer].   The patient has seen decline in her ejection fraction from 55% to 35% in  the past year.  This represents a nonischemic cardiomyopathy, since she  had a catheterization February 2008 which demonstrated nonobstructive  coronary artery disease.  Rate control has been difficult.  The patient  became hospitalized with hypotension on Cardizem and atenolol.  The  patient has had a weight loss of 45 pounds since November 2007 partly  secondary to anorexia and nausea.  The patient has been able to tolerate  exercise better with weight loss.  She now presents for,  AV node  ablation.  Of note, she also has left bundle branch block.  Pacer  upgrade might make sense at this point.   ALLERGIES:  1. AMIODARONE intolerance.  2. The patient had mild pulmonary fibrosis and QT prolongation on      TIKOSYN.  3. CODEINE.  4. IODINE.  5. MACRODANTIN.   PATIENT DISCHARGED ON THE FOLLOWING MEDICATIONS:  1. Glimepiride 8 mg daily.  2. Levoxyl 75 mcg daily.  3. Lovastatin 40 mg at bedtime.  4. Tamoxifen 10 mg twice daily.  5. Diovan 160 mg daily.  6. Xanax 0.5 mg 1/2 tablet at bedtime.  7. The patient is also to stop taking Cartia, digoxin, and atenolol.      Maple Mirza, PA      [name], MD,FACC  Electronically Signed    GM/MEDQ  D:  05/09/2007  T:  05/10/2007  Job:  [id number]   cc:   Duke Salvia, MD, [facility]

## 2010-11-22 NOTE — Assessment & Plan Note (Signed)
[facility]                         ELECTROPHYSIOLOGY OFFICE NOTE   JERE, VANBUREN                   MRN:          [id number]  DATE:04/05/2007                            DOB:          17-Apr-1929    Ms. Primmer comes in one week following AV junction ablation for  uncontrolled atrial fibrillation.  She is now off her Cardizem and  atenolol, feeling a good deal better.  Her heart rate is at 90.   PHYSICAL EXAMINATION:  On examination, her blood pressure was 134/86.  This is a little bit higher than it has been at home.  LUNGS:  Clear.  CARDIAC:  Heart sounds were regular.  EXTREMITIES:  Without edema, although they were quite large.   Interrogation of her pacemaker was notable for modest increases in her  thresholds, such that her RV threshold had gone from about 1 volt at 0.5  to 1.75, at 1, 1.5; at 1.5, 1.35; at 1.5, unipolar.  Autocapture was  activated.   The rate was decreased to 80, and the rate response was turned on.   IMPRESSION:  1. Atrial fibrillation with an uncontrolled ventricular response.  2. Status post atrioventricular junction ablation.  3. History of hypertension, now doing reasonably well on a good deal      less medication.  4. Absence of an ACE inhibitor.   Ms. Beverley is doing better.  I am not quite sure why it is that her  previously prescribed Diovan has been withheld.  I will defer this to  Dr. Andee Lineman.   She will be seeing [place] in the device clinic in [place] in approximately  three Widjaja, at which time, her rate will be reduced to 70.     Duke Salvia, MD, [facility]  Electronically Signed    SCK/MedQ  DD: 04/05/2007  DT: 04/05/2007  Job #: [id number]   cc:   Willeen Niece

## 2010-11-22 NOTE — Assessment & Plan Note (Signed)
[facility]                          [name]   [name], [name]                   MRN:          [id number]  DATE:02/01/2007                            DOB:          May 02, 1929    PRIMARY CARDIOLOGIST:  Dr. Lewayne Bunting.   REASON FOR VISIT:  Scheduled three month follow up. Please refer to Dr.  Margarita Mail clinic note of April 29, for full details.   The patient continues to do quite well since undergoing permanent  pacemaker implantation, by Dr. Sherryl Manges, whom she is scheduled to  see in [place] this coming September. She is on chronic Coumadin  which is currently being followed by Dr. Sherryll Burger, although it had been  managed by [place] in the recent past. She cites the issue of convenience as  a primary determinant.   Clinically, she denies any PND, orthopnea, or exacerbation of her  chronic bilateral lower extremity edema. She uses some salt in her  cooking but does not add any additional salt. In fact, she has actually  lost five pounds since her last office visit.   Regarding medications, the patient cities intolerance to LISINOPRIL  stating that she developed significant nausea. It appears that she had  been on this medication since March, however. She then was started on  Diovan by the late Dr. Weyman Pedro, but she was apparently not able to  tolerate this as well, and for the same reason. She has most recently  been given samples of Diovan HCT 160/12.5 daily and is now requesting  our input as to whether or not this is a suitable medication for her.   Electrocardiogram today reveals underlying atrial fibrillation with  intermittent ventricular pacing at 80 bpm.   CURRENT MEDICATIONS:  1. Levoxyl 0.075 daily.  2. Cardia 120 daily.  3. Coumadin 5 mg as directed.  4. Glimepiride 4 daily.  5. Lovastatin 40 daily.  6. Tamoxifen 10 b.i.d.  7. Alprazolam 0.25 nightly.  8. Atenolol 100 daily.  9. Digoxin 0.25 daily.  10.Metformin 500 b.i.d.   PHYSICAL EXAMINATION:  VITAL SIGNS:  Blood pressure 128/82, pulse 80 and  regular, weight 202.6 (down 5 pounds).  GENERAL:  This is a 77 year old female sitting upright in no distress.  HEENT:  Normocephalic atraumatic.  NECK:  Palpable bilateral carotid pulses; no JVD at 90 degrees.  LUNGS:  Clear to auscultation in all fields.  HEART:  Irregularly irregular (S1, S2).  ABDOMEN:  Benign.  EXTREMITIES:  Chronic bilateral 3-4+ lower extremity edema (left greater  than right).  NEUROLOGIC:  No focal deficits.   IMPRESSION:  1. Permanent atrial fibrillation with tachy-brady syndrome.      a.     Status post St. Jude dual chambered pacemaker by Dr. Sherryl Manges, August 2007.  2. Chronic Coumadin.      a.     Followed by Dr. Kirstie Peri.  3. Non-ischemic cardiomyopathy.      a.     EF 30-40% with global hypokinesis by 2D echocardiogram, May       2008.  b.     Question tachycardia-induced.      c.     Non-obstructive coronary artery disease by cardiac       catheterization, August 2004; EF 63% at that time.  4. Chronic lower extremity edema.  5. Type-2 diabetes mellitus.  6. Hypothyroidism.  7. Chronic renal insufficiency.      a.     Creatinine 1.1 by most recent labs, April 2008.  8. History of pulmonary nodule.  9. Obstructive sleep apnea.      a.     History of non-compliance with CPAP.  10.Insomnia.   PLAN:  1. Agree with recommendation to add Diovan HCT 160/12.5 for management      of cardiomyopathy.  2. Check a baseline BMET and trough digoxin level, particularly in      light of the patient's recurrent nausea and history of renal      insufficiency.  3. Check a follow up BMET in one week after initiation of Diovan HCT.  4. Schedule return clinic follow up with myself and Dr. Andee Lineman in the      next 3-4 months.     Gene Serpe, PA-C  Electronically Signed      [name], MD,FACC  Electronically Signed   GS/MedQ  DD:  02/01/2007  DT: 02/02/2007  Job #: [id number]   cc:   Kirstie Peri, MD

## 2010-11-22 NOTE — Cardiovascular Report (Signed)
[facility]                   [facility]IC NOTE   KENIAH, KLEMMER                   MRN:          [id number]  DATE:04/22/2007                            DOB:          September 01, 1928    Ms. Alarid was seen today in the [facility] for further  management of her pacemaker status post her AV node ablation.   Today we decreased her rate to 70 beats per minute per Dr. Odessa Fleming last  dictation.  She will return to the clinic in 3 months with Dr. Graciela Husbands.  Her rate response was fairly blunted today.  I did not reprogram this.  She is not complaining of any lack of energy and she was programmed VVIR  80 with decrease in the rate to 70.  We will see if this helps with her  heart rate excursion in 3 months' time when she returns to the clinic  with Dr. Graciela Husbands.      Gypsy Balsam, RN,BSN  Electronically Signed      Duke Salvia, MD, [facility]  Electronically Signed   AS/MedQ  DD: 04/22/2007  DT: 04/22/2007  Job #: [id number]

## 2010-11-22 NOTE — Assessment & Plan Note (Signed)
[facility]                            CARDIOLOGY OFFICE NOTE   LEDIA, HANFORD                   MRN:          [id number]  DATE:03/22/2007                            DOB:          August 16, 1928    [name] [name] comes in.  She has atrial fibrillation and is status post  pacemaker for tachy-brady syndrome.  Initially, she had had a  significant nonischemic cardiomyopathy that was presumably tachycardia  mediated with loss of her ejection fraction strength over the last year  from about 55-20% with augmented rate control that improved at most  recent assessment in May 2, about 35% accompanied by significant  decrease in exercise tolerance.   When the pacemaker was implanted last August, she was discharged on  combinations of Cardizem 240, atenolol 100, and lisinopril 40.  She  developed severe hypotension requiring hospitalization.  The medications  have been gradually down-titrated so she is now on atenolol 100/Cardizem  120, and the lisinopril has now been discontinued.   Her other medications include:  1. Digoxin 0.25.  2. Diovan 160.  3. Lovastatin.  4. Glimepiride 4.  5. Levoxyl 75.   EXAMINATION:  Her blood pressure is 113/76.  LUNGS:  Clear.  HEART SOUNDS:  Irregular.  EXTREMITIES:  No edema.   Interrogation of her pacemaker demonstrated that her ventricular  response is poorly controlled such that more than 50% of her beats are  faster than 95 bin.   IMPRESSION:  1. Tachy-brady syndrome with an inadequately controlled ventricular      response.  2. Status post pacer from the above.  3. Hypotension with a medication regime last year consisting of      atenolol 100, Cardizem 240, and lisinopril 44.  4. Intercurrent weight loss.  5. Nausea.  6. Treated hypothyroidism.  7. Improved diabetic control with weight loss.   DISCUSSION:  The issues are how to augment rate control without running  into hypotension as a limiting factor.  I  will try and increase her  Cardizem from 120 to 240.  She is to let us know if she starts feeling  lightheaded.   We will have to then reassess subjectively as to whether we have  adequate rate control.  We will have her come back and get her device  checked in the clinic here in about 4 Lutes' time.   I am concerned about her weight loss and her nausea.  We will plan to  recheck her thyroid; in addition, we will check a CMET.  A digoxin level  of 0.125 with 0.5 previously.  We will recheck a digoxin level as well.     Duke Salvia, MD, [facility]  Electronically Signed    SCK/MedQ  DD: 03/22/2007  DT: 03/23/2007  Job #: [id number]

## 2010-11-22 NOTE — Assessment & Plan Note (Signed)
[facility]                          [name]   [name] [name], [name] [name]                   MRN:          [id number]  DATE:05/07/2007                            DOB:          [birth year]    PRIMARY CARE PHYSICIAN:  Dr. Kirstie Peri.   REASON FOR VISIT:  A three-month followup.   HISTORY OF PRESENT ILLNESS:  [name] [name] is a 77 year old female patient  with a history of permanent atrial fibrillation, intolerant to  amiodarone and Tikosyn therapy, who has failed cardioversion in the  past, as well as tachy/brady syndrome, status post pacemaker  implantation, who presents to the office today for followup.  She was  last seen by Dr. [name] at [facility] in mid-  September 2008, when she presented with syncope.  She was noted to be  hypotensive on a multi-drug regimen for AV nodal control.  It was felt  that the patient needed AV nodal ablation to control her heart rate.  Of  note, she does have a tachycardia-induced cardiomyopathy with previous  ejection fraction of 30%-40%.   She was transferred to [facility].  She apparently  underwent AV nodal ablation.  Unfortunately there is no discharge  summary or procedure note.  This was apparently successful.  She was  taken off of all her rate-controlling medications, as well as her  Diovan.   She has been seen back in followup with Dr. Duke Salvia since  discharge from the hospital, as well as at [facility] in [place].  She seems to be doing well and her heart rate is now set at 70.   The patient tells me today that she feels wonderful.  She denies any  significant shortness of breath.  She describes NYHA class 2 symptoms.  She denies orthopnea, PND or pedal edema.  She denies any chest pain.  She denies any further syncope or near syncope.   CURRENT MEDICATIONS:  1. Coumadin as directed.  2. Alprazolam 0.5 mg, 1/2 tab daily.  3.  Metformin 500 mg daily.  4. Levoxyl 0.075 mg daily.  5. Glimepiride 8 mg daily.  6. Lovastatin 40 mg q.h.s.  7. Tamoxifen 10 mg twice daily.   PHYSICAL EXAMINATION:  GENERAL:  She is a well-developed and well-  nourished female.  VITAL SIGNS:  Blood pressure 129/80, pulse 70, weight 200 pounds.  HEENT:  Normal.  NECK:  Without jugular venous distention.  HEART:  S1 and S2.  A regular rate and rhythm.  LUNGS:  Clear to auscultation bilaterally.  ABDOMEN:  Soft, nontender.  EXTREMITIES:  Without pitting edema.  NEUROLOGIC:  She is alert and oriented x3.  Cranial nerves II-XII  grossly intact.   IMPRESSION:  1. Permanent atrial fibrillation      a.     Intolerant to [facility] AND AMIODARONE therapy.      b.     Tachycardia/bradycardia syndrome - status post St. Jude dual-       chamber pacemaker implantation.      c.     Status post atrioventricular nodal  ablation in September       2008, to control ventricular rate.  2. Non-ischemic cardiomyopathy      a.     Tachycardia-induced.      b.     Ejection fraction 30%-40% in the past.  3. Non-obstructive coronary artery disease by cardiac catheterization      in February 2008.  4. Coumadin therapy.  5. Diabetes mellitus.  6. Hypothyroidism.  7. Mild renal insufficiency.  8. History of pulmonary nodule.  9. Obstructive sleep apnea.   PLAN:  The patient presents to the office today for followup.  As noted  above, since her AV nodal ablation she feels great.  She is now NYHA  class 2.  Her cardiomyopathy is felt to be tachycardia-mediated in the  past.  At this point in time we plan  1. Make no medication changes today.  2. Proceed with follow-up echocardiogram in four Greb, to reassess      her LV function.  I suspect her ejection fraction has recovered.  3. Recheck her PT and INR today with the [facility].  4. Will arrange followup in this office in three months, or sooner      p.r.n.      Tereso Newcomer, PA-C   Electronically Signed      [name], MD,FACC  Electronically Signed   SW/MedQ  DD: 05/07/2007  DT: 05/07/2007  Job #: [id number]   cc:   Kirstie Peri, MD

## 2010-11-22 NOTE — Op Note (Signed)
NAMESAVANNA, DOOLEY            ACCOUNT NO.:  [id number]   MEDICAL RECORD NO.:  [id number]          PATIENT TYPE:  INP   LOCATION:  4733                         FACILITY:  [facility]   PHYSICIAN:  Doylene Canning. Ladona Ridgel, MD    DATE OF BIRTH:  24-Jun-1929   DATE OF PROCEDURE:  03/29/2007  DATE OF DISCHARGE:  03/30/2007                               OPERATIVE REPORT   PROCEDURE PERFORMED:  Atrioventricular node ablation.   ATTENDING:  Doylene Canning. Ladona Ridgel, MD   INDICATIONS:  Symptomatic atrial fibrillation.   I. INTRODUCTION:  The patient is a very pleasant 77 year old woman with  a history of atrial fibrillation which has been rapid and difficult to  control for some time.  With this, she has had heart failure symptoms  and is now referred for AV node ablation secondary to her atrial  fibrillation with rapid ventricular response.   II. PROCEDURE:  After informed consent was obtained, the patient was  taken to the diagnostic EP lab in the fasting state.  After the usual  preparation and draping, intravenous fentanyl and midazolam were given  for sedation.  Mapping was subsequently carried out after a 7-French  quadripolar ablation catheter was inserted percutaneously under the  fluoroscopic guidance into the right atrium.  The H-V interval was 46  milliseconds.  A single RF energy application was also delivered the AV  node region, resulting in creation of complete heart block.  The patient  was returned to her room in satisfactory condition.  The pacemaker was  reprogrammed following her ablation.   III. COMPLICATIONS:  There were no immediate procedural complications.   IV. RESULTS:  This demonstrated successful AV node ablation in a patient  with atrial fibrillation and rapid ventricular response followed by  reprogramming of her pacemaker to the VVI mode at 90 beats per minute.      Doylene Canning. Ladona Ridgel, MD  Electronically Signed     GWT/MEDQ  D:  07/19/2007  T:  07/19/2007  Job:   [id number]

## 2010-11-22 NOTE — Assessment & Plan Note (Signed)
[facility]                          [name]   [name] [name], [name] [name]                   MRN:          [id number]  DATE:11/05/2007                            DOB:          1928/10/02    PRIMARY CARDIOLOGIST:  [name], MD.   PRIMARY ELECTROPHYSIOLOGIST:  Duke Salvia, MD, in [place].   REASON FOR VISIT:  Scheduled clinic followup.   HISTORY:  Since last seen here in the clinic in January of this year by  Dr. Sherryl Manges, the patient has since undergone simple mastectomy of  the right breast, by Dr. Ovidio Kin, this past February.  This was for  treatment of invasive carcinoma in the setting of previous breast cancer  on the contralateral side, for which she also underwent mastectomy.   From a cardiac standpoint, she continues to do well with no interim  development of decompensated heart failure.  She does, however, suggest  some slight increase in shortness of breath, but it is not altogether  clear if this is at rest or solely with exertion.  Nevertheless, she  denies any PND, continues to sleep on 2 pillows as before, and has  chronic bilateral lower extremity edema with no recent exacerbation.  She denies any chest pain or tachy palpitations.  She can climb a flight  of stairs, albeit slowly, with no significant shortness of breath.   We reviewed her current medication regimen.  It is has been noted,  during previous clinic visits here, that she is not either on an ACE  inhibitor or an ARB.  When I last saw her in July 2008, I suggested that  she continue on samples of Diovan HCT 160/12.5, which had just been  provided to her by Dr. Weyman Pedro.  She is unable to recall today,  however, as to why she no longer is on this.  With respect to the  question of previously having been on lisinopril, my note suggests that  this was stopped secondary to significant associated nausea.   CURRENT MEDICATIONS:  1. Coumadin 5  mg as directed, followed by Dr. Sherryll Burger.  2. Alprazolam 0.25 mg q.a.m.  3. Metformin 500 mg daily.  4. Levoxyl 0.075 mg daily.  5. Lovastatin 40 mg q.h.s.  6. Aspirin 162 mg daily.  7. Glimepiride 4 mg daily.   PHYSICAL EXAMINATION:  VITAL SIGNS:  Blood pressure 128/88, pulse 69  regular, weight 206.2 (up 6).  GENERAL:  A 77 year old female sitting upright in no distress.  HEENT:  Normocephalic, atraumatic.  NECK:  Palpable bilateral carotid pulses without bruits; no JVD at 90  degrees.  LUNGS:  Clear to auscultation in all fields.  HEART:  Regular rate and rhythm (S1,S2) no significant murmurs.  No rubs  or gallops.  ABDOMEN:  Soft, nontender with intact bowel sounds.  EXTREMITIES:  2-3+ bilateral chronic, nonpitting edema.  NEURO:  No focal deficits.   IMPRESSION:  1. Permanent atrial fibrillation with tachybrady syndrome.      a.     Status post AV junction ablation/St. Jude dual-chamber       pacemaker  implantation, August 2007.  2. Chronic Coumadin, followed by Dr. Kirstie Peri.  3. Nonischemic cardiomyopathy.      a.     Question tachycardia induced.      b.     Ejection fraction 30-40% with global hypokinesis, May 2008.      c.     Ejection fraction 45-50% by 2-D echo, November 2008.  4. History of right ventricular pacemaker lead thrombus.      a.     Negative blood cultures; aspirin added to Coumadin.  5. Hypertension.  6. Type 2 diabetes mellitus.  7. Dyslipidemia.  8. Treated hypothyroidism.  9. Chronic renal insufficiency.  10.Obstructive sleep apnea.      a.     History of CPAP noncompliance.  11.Chronic lower extremity edema.  12.History of pulmonary nodule.      a.     Resolved by repeat chest x-ray, September 2008.   PLAN:  1. A 2-D echocardiogram for reassessment of left ventricular function      and follow up of previously noted RV lead thrombus.  2. Medication adjustment with resumption of Diovan 160/12.5 mg daily      for management of hypertension and  cardiomyopathy.  3. Extensive baseline labs with CMET, TSH, BNP and fasting lipid      profile.  4. Follow up BMET in 1 week for monitoring of electrolytes and renal      function.  5. Schedule return clinic followup with myself and Dr. Andee Lineman in 6      months, or sooner as      needed.  6. Continue regular scheduled follow up with Dr. Sherryl Manges here in      our pacer clinic.      [name] Serpe, PA-C  Electronically Signed      [name], MD,FACC  Electronically Signed   GS/MedQ  DD: 11/05/2007  DT: 11/05/2007  Job #: [id number]   cc:   Kirstie Peri, MD

## 2010-11-22 NOTE — Op Note (Signed)
[name], [name]            ACCOUNT NO.:  [id number]   MEDICAL RECORD NO.:  [id number]          PATIENT TYPE:  OIB   LOCATION:  5123                         FACILITY:  [facility]   PHYSICIAN:  Sandria Bales. Ezzard Standing, M.D.  DATE OF BIRTH:  Aug 06, 1928   DATE OF PROCEDURE:  08/22/2007  DATE OF DISCHARGE:  08/23/2007                               OPERATIVE REPORT   Why can't the operative date be placed here?   PREOPERATIVE DIAGNOSIS:  Invasive carcinoma of the right breast.   POSTOPERATIVE DIAGNOSIS:  1. Invasive carcinoma of the right breast.  2. Negative right axillary sentinel lymph node on touch prep.   PROCEDURE:  1. Injection of methylene blue, right axillary sentinel lymph node      biopsy (counts of 400, background of 15).  2. Right simple mastectomy.   SURGEON:  Sandria Bales. Ezzard Standing, MD   FIRST ASSISTANT:  Magnus Ivan, RNFA   ANESTHESIA:  General LMA.   ESTIMATED BLOOD LOSS:  250 mL.   DRAINS LEFT IN:  One 19-Blake drain.   HISTORY:  [name] [name] is a 77 year old white female who is a patient of Dr. Kirstie Peri of [facility].  She also sees Dr. Lewayne Bunting from a  [facility] stand point.   She had a left breast cancer in December of 2005 treated with  mastectomy.  She has been placed on Tamoxifen postop.  This surgery was  done by Dr. Marlene Bast.  She has done well since that time.  A recent  mammogram performed at [place] on July 01, 2007 showed a  new 9 mm nodule of the right breast.  A biopsy of this on July 30, 2007 shows an invasive ductal carcinoma.  The specimen number is [id number].   Discussion was carried out with the patient about the options for  treatment.  She is certainly a candidate for a lumpectomy or partial  mastectomy, and radiation therapy.  She already elected to have a  mastectomy on her opposite site and for her purposes the mastectomy made  the most sense.  This would obviate a need for radiation therapy unless  there was some surprise finding.  I did talk to her about a sentinel  lymph node biopsy.  The risk of surgery included bleeding, infection,  recurrence of the cancer.   OPERATIVE NOTE:  The patient was placed in supine position in the  holding area before.  She had been injected with 1 millicurie of  technetium sulphur colloid.  Then we go into the operation room after we  got her sleep, I then injected her right breast with about 2 mL of 40%  methylene blue.  I put these in subdermal fashion around the nipple and  in the subareolar space.   I then prepped and draped the right breast.  I had already marked the  right breast, though she only had 1 breast, so it is not really a matter  of side in this surgery.  A time out was held at the initiation of the  procedure, identifying the patient and the procedure.  She is also given  a gram of Ancef on preop evaluation.   The patient was placed in supine position and given general LMA  anesthesia.  Her right chest was prepped with Betadine solution and  slowly draped.   I mad an elliptical incision which included her nipple and areola on the  right side.  I tried to take this out to the edge of the maxilla and  medial to about 2 fingerbreadths medial to the lateral edge of the  clavicle.   I then developed a superior and inferior and  medial and lateral flaps  that went cranial to 2 fingerbreadths above the clavicle.  Of note she  had a right subclavian pacemaker, but I stayed well away from the sac or  evidence of any wires.  Inferiorly I dissected to the investing fascia  of the rectus abdominis muscle, I went medially to the sternum and  laterally to the latissimus dorsi muscle.  My first dissection was to go  through the lateral aspect of her incision, go down and finding a  sentinel lymph node.  I found a node with counts of approximately 450.  It was pale blue.  I saw some background of 15, but this was sent off  for specimen for a  touch prep of sentinel lymph node.  Dr. Tammi Sou  called back and said the sentinel lymph node was negative.  I carried  again dissection of the breast also again with dissected flaps 2 fingers  below the clavicle, medially to lateral edge of the sternum, inferiorly  to the rectus abdominis muscle, may be 2 fingerbreadths below the  clavicle, below the right side.   I then elevated the breast off.  I marked the breast with a long suture  laterally and sent this for permanent pathology.  The wound was then  irrigated.  She really had sort of redundant skin for the breast.  I  went inferiorly.  I took off about another inch of skin superiorly.  I  marked each of these skin areas with a long suture cranial and a short  suture laterally, so again, superior and inferior breast skin.  This  then allowed the skin to come to a fairly good approximation.  I put in  a 19-Blake drain through the axilla.  I only put a single Blake drain  because I really had a limited axillary dissection.  I sewed this Blake  drain in with a 2-0 nylon suture.  I then closed the skin with  subcutaneous 3-0 Vicryl suture, closed the skin with skin guns.  Steri-  Stripped it.  I sterilely dressed it with a pressure dressing.   The patient tolerated the procedure well, was transported to the  recovery room in good condition.  Sponge and needle count correct.  The  patient tolerated the procedure well.      Sandria Bales. Ezzard Standing, M.D.  Electronically Signed     DHN/MEDQ  D:  08/22/2007  T:  08/23/2007  Job:  [id number]   cc:   Kirstie Peri, MD  [name], MD,FACC

## 2010-11-22 NOTE — Assessment & Plan Note (Signed)
[facility]                          [name]   [name] [name], [name] [name]                   MRN:          [id number]  DATE:12/21/2008                            DOB:          [birth year]    HISTORY OF PRESENT ILLNESS:  The patient is a 77 year old female with a  history of permanent atrial fibrillation, tachybrady syndrome.  She also  has a nonischemic cardiomyopathy with near normal ejection fraction post  pacemaker implantation.  Next, the patient states that she is doing  well.  She has no chest pain, shortness of breath, orthopnea, or PND.  She has no palpitations or syncope.  The patient is somewhat in motion  the office today, likely causing her elevation in blood pressure.  However, she states that normally her blood pressure is within normal  limits.  She also sees Dr. Sherryll Burger for this.  On her last office visit,  blood pressure was also 128/88.   MEDICATIONS:  1. Coumadin as directed.  2. Alprazolam as directed.  3. Metformin 5 mg p.o. daily.  4. Levoxyl 75 mg p.o. daily.  5. Aspirin 81 mg 2 tablets in the a.m.  6. Glimepiride 4 mg p.o. daily.  7. Complex B vitamin daily.  8. Fish oil 1000 mg p.o. daily.  9. Zyrtec 10 mg p.o. daily.  10.Diovan and hydrochlorothiazide 160/12.5 mg half a tablet p.o.      daily.  11.Lipitor 20 mg p.o. daily.   PHYSICAL EXAMINATION:  VITAL SIGNS:  Blood pressure is 165/100, heart  rate 70, weight 237 pounds.  GENERAL:  Well-nourished white female in no apparent distress.  HEENT:  Pupils; eyes are equal.  Conjunctivae clear.  NECK:  Supple.  Normal carotid upstroke and no carotid bruits.  LUNGS:  Clear breath sounds bilaterally.  HEART:  Regular rate and rhythm with normal S1 and S2.  No murmurs,  rubs, or gallops.  ABDOMEN:  Soft, nontender.  No rebound or guarding with good bowel  sounds.  EXTREMITIES:  No cyanosis, clubbing, or edema.  NEURO:  The patient is alert, oriented, and grossly  nonfocal.   PROBLEM LIST:  1. Permanent atrial fibrillation, tachybrady syndrome.      a.     Status post atrioventricular nodal ablation/St. Jude dual       chamber pacemaker implantation.  2. Chronic Coumadin therapy followed by Dr. Sherryll Burger.  3. Nonischemic cardiomyopathy.      a.     Question tachycardia induced.      b.     Ejection fraction 30-40% with global hypokinesis on Nov 15, 2006.      c.     Ejection fraction 40-50% by 2-D echo on November 2008.  4. History of right ventricular pacemaker with lead thrombus.      a.     Negative blood cultures with addition of aspirin to       Coumadin.  5. No embolic events.  6. Hypertension.  7. Type 2 diabetes mellitus.  8. Dyslipidemia.  9. Treated hypothyroidism with normal free T4 and TSH now and chronic  renal insufficiency, obstructive sleep apnea, chronic lower      extremity edema which is nonpitting.  10.History of pulmonary nodule, resolved by chest x-ray in September      2008.   PLAN:  1. The patient was given Lasix for lower extremity edema.  However,      the patient has nonpitting edema.  This is related to venous      insufficiency and also fat.  The patient does not have any      evidence of right heart failure.  As a matter of fact, her BNP      level was extraordinarily low at 49.  This in the setting of mild      LV dysfunction.  2. The patient can continue her current medical regimen.  Her blood      pressure is not controlled, but she states that, otherwise, her      blood pressures are within normal range.  3. The patient can followup with [place] in 6 months.     [name], MD,FACC  Electronically Signed    GED/MedQ  DD: 12/21/2008  DT: 12/22/2008  Job #: [id number]   cc:   Kirstie Peri, MD

## 2010-11-25 NOTE — Assessment & Plan Note (Signed)
[facility]                          [name]   NAME:WEEKSCarleah, Yablonski                   MRN:          [id number]  DATE:11/06/2006                            DOB:          [birth year]    HISTORY OF PRESENT ILLNESS:  The patient is a 77 year old female with a  history of known obstructive coronary artery disease and recurrent  atrial fibrillation.  The patient has developed a nonischemic  cardiomyopathy, possibly tachycardia induced.  She has markedly  improved.  She has not required diuretic therapy.  On EKG today, she  appears to be in atrial fibrillation with ventricular paced rhythm.  Interrogation of the device also reveals that she has no significant  breakthrough episodes of rapid heart rate.  Clinically, I anticipate  that her ejection fraction is markedly improved.  The patient has had no  shortness of breath, has no orthopnea or PND.  She has no palpitations  or syncope.   MEDICATIONS:  1. Levoxyl 75 mcg p.o. daily.  2. Cartia XT 120 mg p.o. daily.  3. Coumadin as directed.  4. Glimepiride 4 mg daily.  5. Lovastatin 40 mg a day.  6. Tamoxifen 10 b.i.d.  7. Alprazolam.  8. Atenolol 100 mg p.o. daily.  9. Digoxin 0.25 two tablets daily.  10.Lisinopril 5 mg p.o. daily.   PHYSICAL EXAMINATION:  VITAL SIGNS:  Blood pressure 100/54, heart rate  78 beats per minute, weight is 270 pounds.  NECK:  Normal carotid upstrokes.  No carotid bruits.  LUNGS:  Clear.  HEART:  Regular rate and rhythm.  Normal sinus.  ABDOMEN:  Soft.  EXTREMITIES:  No edema.   PROBLEM LIST:  1. Nonischemic cardiomyopathy.      a.     Last ejection fraction of 35%.      b.     Possible tachycardia-induced cardiomyopathy.      c.     Non-obstructive coronary artery disease by catheterization.      d.     False positive adenosine Cardiolite study.  2. Permanent atrial fibrillation with tachy-brady syndrome.      a.     Recurrent atrial  fibrillation with failed cardioversion.      b.     Intolerant to antiarrhythmic drug therapy including Tikosyn       and amiodarone.      c.     Status post St. Jude dual chamber pacemaker implantation.  3. Chronic Coumadin therapy.  4. Type 2 diabetes mellitus.  5. Hypothyroidism.  6. History of pulmonary nodule.  7. Renal insufficiency.  Creatinine stable at 1.3.   PLAN:  1. The patient is markedly improved.  She is less short of breath.      The fact that she is not requiring diuretics makes me think that      she could have improved her ejection fraction.  2. Will proceed a little bit earlier with getting a 2-D      echocardiographic study later this well.  I anticipate the      patient's ejection fraction has improved.  3. Interrogation also of her  pacemaker revealed that she has no      significant rapid heart rate which confirmed the possible diagnosis      of tachycardia-induced cardiomyopathy.  Will continue to treat the      patient with aggressive rate control, and if she has breakthrough      atrial fibrillation with rapid heart rate, consideration will still      be given to AV nodal ablation.     [name], MD,FACC     GED/MedQ  DD: 11/06/2006  DT: 11/06/2006  Job #: [id number]   cc:   Weyman Pedro

## 2010-11-25 NOTE — Assessment & Plan Note (Signed)
[facility]                          [name]   HUBERTA, TOMPKINS                     MRN:          [id number]  DATE:08/31/2006                            DOB:          [birth year]    REFERRING PHYSICIAN:  Weyman Pedro   HISTORY OF PRESENT ILLNESS:  The patient is a 77 year old female with a  history of nonobstructive coronary artery disease.  The patient has  developed recent decreasing exercise tolerance and increased shortness  of breath.  The patient was found to be in recurrent atrial  fibrillation.  She was referred to Dr. Graciela Husbands for further  recommendations.  The patient has a history of tachybrady syndrome and  is status post St. Jude pacemaker.  She now has also developed worsening  LV dysfunction with an ejection fraction of 35%.  She was referred for a  catheterization to rule out significant coronary disease.  She had  nonobstructive disease as assessed by Dr. Antoine Poche.  The plan is now to  proceed with TEE-guided cardioversion to restore normal sinus rhythm and  further address the patient's rhythm abnormality.  Also, she will need  additional medication for LV dysfunction.  However, there is no evidence  of heart failure and her LV pressures were actually within normal limits  during her last catheterization.  The patient had to come briefly off  Coumadin for her catheterization and is currently nontherapeutic.  Her  INR is 1.3 today.   MEDICATION:  1. Levoxyl 75 mcg p.o. daily.  2. Cartia 120 mg p.o. daily.  3. Coumadin as directed.  4. Glimepiride.  5. Lovastatin 40 mg a day.  6. Tamoxifen 10 mg p.o. b.i.d.  7. Alprazolam.  8. Atenolol 100 mg p.o. daily.  9. Digoxin 0.125 two tablets p.o. daily.   PHYSICAL EXAMINATION:  VITAL SIGNS:  Blood pressure is 127/82, heart  rate is 71 beats per minute.  NECK:  Normal carotid upstroke, no carotid bruits.  LUNGS:  Clear breath sounds bilaterally.  HEART:  Regular  rate and rhythm.  Normal S1, S2.  No murmurs, rubs, or  gallops.  ABDOMEN:  Soft, nontender, no rebound or guarding, good bowel sounds.  EXTREMITIES:  No cyanosis, clubbing or edema.   IMPRESSION:  1. Cardiomyopathy with ejection fraction 35%.      a.     Nonischemic and nonobstructive coronary artery disease -       catheterization last week.      b.     False-positive adenosine Cardiolite study.  2. Permanent atrial fibrillation with tachybrady syndrome.      a.     Now controlled ventricular response.      b.     Intolerant to antiarrhythmic drug therapy including Tikosyn       and amiodarone.      c.     Status post St. Jude dual-chamber pacemaker implantation.  3. Chronic Coumadin therapy.  4. Paroxysmal orthopnea.  5. Type 2 diabetes mellitus.  6. Hypothyroidism.  7. Lower extremity edema.  8. History of pulmonary nodule.  9. Renal insufficiency, creatinine 1.5.   PLAN:  1. The patient will have a BMET done after her recent catheterization      to make sure she does not have renal insufficiency.  2. Digoxin level will also be checked in anticipation of      cardioversion.  3. The patient wants to be cardioverted as soon as possible.      Therefore, proceed with TEE cardioversion next week as soon as the      patient's INR is therapeutic.  4. The patient is intolerant of multiple antiarrhythmic drugs and      recommendation was given by Dr. Graciela Husbands to possibly proceed with AV      nodal ablation if the patient's heart rate remains uncontrolled and      she requires increasing medications which actually might be      contributing to her exercise intolerance.     [name], MD,FACC  Electronically Signed    GED/MedQ  DD: 08/31/2006  DT: 08/31/2006  Job #: [id number]   cc:   Weyman Pedro

## 2010-11-25 NOTE — Assessment & Plan Note (Signed)
[facility]                           ELECTROPHYSIOLOGY OFFICE NOTE   NAME:[name], [name]                   MRN:          [id number]  DATE:03/28/2006                            DOB:          01-24-1929    HISTORY OF PRESENT ILLNESS:  [name] [name] was seen today in the clinic on  March 28, 2006, for a wound check of her newly implanted St. Jude, model  [id number].  Date of implant was March 07, 2006, for tachybrady  syndrome.  On interrogation of her device today, her battery voltage was  2.79.  T waves were greater than 5 millivolts with an atrial capture  threshold of 0.75 volts at 0.5 milliseconds and an atrial lead impedence of  359.  R waves measured 11.8, greater than 12 millivolts with a ventricular  capture threshold of 1.25 volts at 0.5 milliseconds and a ventricular lead  impedence of 650.  There were 13 mode switches noted totally 35% total time.  No changes were made in her parameters today.  She will be seen again in  December by Altha Harm, LPN and Sherryl Manges, M.D., [facility].  Her Steri-  strips had already been removed (an addendum), and there is no redness or  edema to the incision line.                                   Altha Harm, LPN                                Duke Salvia, MD, [facility]   PO/MedQ  DD:  03/28/2006  DT:  03/29/2006  Job #:  [id number]

## 2010-11-25 NOTE — Discharge Summary (Signed)
NAMEDEXTER, SAUSER            ACCOUNT NO.:  [id number]   MEDICAL RECORD NO.:  [id number]          PATIENT TYPE:  INP   LOCATION:  4741                         FACILITY:  [facility]   PHYSICIAN:  Maple Mirza, PA   DATE OF BIRTH:  [birth year]   DATE OF ADMISSION:  03/06/2006  DATE OF DISCHARGE:                                 DISCHARGE SUMMARY   ALLERGIES:  CODEINE, IODINE, MACRODANTIN, AMIODARONE INTOLERANCE SECONDARY  TO PULMONARY TOXICITY.  Failed TIKOSYN secondary to prolongation of QT.   PLAN:  If the patient's rapid ventricular rate with atrial fibrillation  persists on Mednet probable AV node ablation would be next step then.   PRINCIPAL DIAGNOSES:  1. Atrial fibrillation rapid ventricular rate - tachy-brady syndrome with      pauses up to 4.5 seconds.      a.     Symptomatic atrial fibrillation:  Palpitations, dyspnea, chest       tightness, weakness.      b.     History of paroxysmal atrial fibrillation, chronic Coumadin.      c.     Amiodarone tried, halted secondary to decreased DLCO/mild       pulmonary fibrosis on CT.      d.     Tikosyn stated this admission but discontinued secondary to       prolonged QT.  2. Discharged day 6 status post transesophageal echocardiogram, no left      atrial appendage thrombus.  3. Discharging day 5, status post implantation of St. Jude Victory dual      chamber pacemaker implanted on the right side.  4. Left bundle branch block QRS is 140 msec.  5. Troponin I studies are negative this admission.  They are 0.03, then      0.02.  6. Atrial fibrillation proving difficult to rate control.  7. Diabetes hitherto diet controlled with persistently elevated serum      glucose, newly started on Amaryl at 1 mg up titrated to 4 mg at      discharge.  Hemoglobin A1c was 7.8 this admission.  8. Flare of gout in the right hallux, treated immediately with colchicine      and then to go home on allopurinol.   SECONDARY DIAGNOSES:  1. Left  heart catheterization 2004, ejection fraction 63%, moderate LAD      and diagonal disease.  2. Dobutamine stress test 2006, no ischemia.  3. Obstructive sleep apnea/CPAP.  4. History of breast cancer status post left mastectomy.  5. History of thrombophlebitis.  6. Treated hypothyroidism.  7. Gout.   PROCEDURE:  1. March 08, 2006, transesophageal echocardiogram low normal left      ventricular function, ejection fraction 50%.  Paradoxical septal motion      consistent with paced rhythm, no left atrial appendage thrombus.      Moderate atherosclerosis descending aorta.  Trace mitral regurgitation,      mild tricuspid regurgitation.  2. March 07, 2006, implantation of St. Jude Victory XL DR dual chamber      pacemaker for tachy-brady syndrome, Dr. Sherryl Manges.  3. Beginning on August  30, Tikosyn therapy at 500 mcg every 12 hours.  The      patient converted after one dose of Tikosyn approximately  two hours      after administration of the drug.   BRIEF HISTORY:  [name] is a 77 year old female.  She has a  longstanding history of paroxysmal atrial fibrillation.  She has been  controlled with Atenolol at 50 mg daily.  The patient had been treated with  amiodarone prior to an August visit in September 2006; however, follow-up  pulmonary function studies showed a significant change in DLCO and CT scan  demonstrated mild pulmonary fibrosis.  Therefore, amiodarone was  discontinued.  Since September 2006, the patient has done well on Atenolol.  She has had no significant paroxysms or palpitations.   More recently, the patient was seen about a week ago by Dr. Eliberto Ivory in the  office and complained of significant palpitations.  It was felt that she had  recurrence of her atrial fibrillation.  A CardioNet monitor was placed as  the patient also reports presyncope and there are concerns that the patient  is having some bradyarrhythmias.   The CardioNet monitor revealed heart  rates in the 30s and there was one  pause of 4.5 seconds at 6 o'clock p.m. on August 24.  The patient also  demonstrated first degree AV block and interventricular conduction delay.  The pause episode was associated clinically with dizziness.   In retrospect the patient has also had several episodes of presyncope  recently.  The patient had been scheduled for an office visit but on the  afternoon of August 27, developed shakiness, dyspnea and chest tightness.  She experienced significant palpitation.  She was found to be in atrial  fibrillation at [facility] emergency room with rapid ventricular  rate.  Her heart rate was about 153 beats per minute.  The patient was  stated on IV Cardizem with heart rate controlled, which was not good, but  better.  The patient did report several episodes of chest pressure and  shortness of breath which was associated with palpitations.   The patient has had a prior ischemia work-up and had catheterization done in  2004.  This showed moderate LAD disease and diagonal disease.  Ejection  fraction is normal at 63%.  There was no mitral regurgitation.  A follow-up  dobutamine stress echocardiogram showed no definite ischemia.  On admission  to [facility], the patient has been ruled out with one set of cardiac enzymes  with troponin of 0.03.  The patient was seen at [facility], reports  no chest pain, shortness of breath.  Her heart rate is controlled.   With evidence of tachy-brady syndrome and episodes of bradyarrhythmia and  pauses, the patient will be considered for permanent pacemaker.  In  addition, the patient also has clinical symptoms which include palpitations,  chest pain and dyspnea which are concurrent with rapid ventricular rates.  After pacemaker implantation, consideration will be given to Tikosyn alone  versus AV node suppression either with pharmacal therapy or with AV node  ablation.  HOSPITAL COURSE:  The patient was  presented to [facility] on August  28 in the afternoon with a diagnosis of tachy-brady syndrome with pauses and  symptomatic atrial fibrillation with rapid ventricular rate.  She was seen  by Dr. Graciela Husbands and scheduled for pacemaker implantation to following day on  August 29.  Her admission troponin I studies were 0.03, then 0.02.  The  patient was not complaining of any significant chest pain but mainly of  shortness of breath.  Prior to implantation of the pacemaker, the patient's  heart rate proved difficult to control with IV Cardizem, measuring between  120 and 140 beats per minute.  On August 29, a St. Jude Victory dual chamber  pacemaker which was set in DDDR mode was implanted by Dr. Sherryl Manges and  the patient had no post procedural complications.  The next day August 30,  the patient underwent transesophageal echocardiogram, which showed no  evidence of thrombus in the left atrial appendage and ejection fraction of  50%.  That very afternoon, the patient was started on Tikosyn therapy at 500  mcg every 8 hours.  Approximately three to four hours after her first dose,  she spontaneously converted to sinus rhythm, maintaining as she was at this  time on digoxin 0.125 mg daily, Atenolol 100 mg one time and Diltiazem 60 mg  every eight hours.  At the beginning of her therapy her QT subset C was not  prolonged from baseline and it was felt that she would discharge after  observation on Tikosyn about 72 hours.  Over the ensuing 48 hour period, it  was noted that her QT subset C interval did prolong and her Tikosyn was cut  from 500 mcg every 12 hours to 250 mcg every 12 hours and then finally down  to 125 mcg every 12 hours.  Despite this reduction in medication dose, the  patient's QT subset C remained prolonged and despite left bundle branch  block, it was markedly different from her baseline EKG.  In lead II a QT  subset C was 573.  Her baseline QT subset C was about 505.   For this reason  her Tikosyn was discontinued on September 4 and preparations for the patient  to discharge were inaugurated.  At the time of discharge her rate  controlling medications are Cardizem 240 mg daily, digoxin 0.125 mg daily  and Atenolol 100 mg daily.  At the time of discharge she is A paced at 60  beats per minute and is symptom free.   In an additional issue this admission, the patient stated that her diabetes  was diet controlled.  Her HTB A1C proved to be 7.8 and internal medicine was  consulted for this.  She was started on 1 mg of Amaryl which was then up  titrated to 2 mg and then once again up titrated to 4 mg as her fasting  serum glucose remained elevated.  [name] [name] was discharged with the  following medications.   1. Coumadin 6 mg daily.  This is a 5 mg tablet she already has plus a 1 mg      tab.  This represents a new dose.  2. Atenolol 100 mg daily.  This is a new dose. 3. Amaryl 4 mg daily.  This is a new medication for diabetic control.  4. Cardizem CD 240 mg daily.  This is also new.  5. Digoxin 0.125 mg daily. This is also new.  6. Lisinopril 40 mg daily.  7. Lasix 20 mg daily.  8. Potassium chloride 10 mEq daily.  This is also new, a supplement for      Lasix and to keep her potassium levels above 4.  9. Lovastatin 40 mg daily at bedtime.  10.Levoxyl 75 mcg daily.  11.Allegra 180 mg daily.  12.Tamoxifen 10 mg daily.  13.Alprazolam 0.5 mg as needed.  14.Calcium  600 mg twice daily.   INSTRUCTIONS:  Diet:  Low sodium, low cholesterol diabetic diet.  She is  asked not to drive for the next week, not until Wednesday September 5.  She  is not lift anything heavier than 10 pounds for the next 4 [place].  She is to  keep her incision dry until Wednesday September 5 and to sponge bathe until  then.  Follow-up office visits have also been made for Ms. Grego.   1. Dr. Magnus Ivan office, Coumadin clinic, Friday September 7.  She can come      at any time.  She is  also to see Dr. Eliberto Ivory Thursday September 13 at      10:45 to discuss control of diabetes.  2. Pacer clinic at [facility] [address] [place],      Wednesday September 19 at 9.  3. To see Dr. Andee Lineman in [place] office of [facility] on Friday      September 28 at 12:15.  4. To see Dr. Graciela Husbands of [facility] office Tuesday,      December 11 at 2:10 p.m.   She will also be wearing the Cardionet monitor so that [facility] can assess the  ventricular rates on the new medication therapy that has been started here  at [facility].   LABORATORY DATA:  At discharge serum electrolytes on September 5, sodium  140, potassium 5.2, chloride 106, carbonate 26, BUN 28, creatinine 1.3 and  glucose 152.  Her potassium is a little elevated from 3.9 on September 4.  She had gotten at that time 40 mEq on September 4.  Her new dose of 10 mEq  should do a better job of keeping her potassium over 4.0.  The PT INR the  day of discharge September 5, PT was 25.1, INR 2.2.  She has  been on 6 mg of Coumadin for the last 3 days.  Her liver function studies  alkaline phosphatase 55, SGOT 23, SGPT 15, TSH this admission was 2.344,  repeating HGBA1c 7.8, digoxin level on admission on August 30 was 0.9.   This was a 50 minute discharge.           ______________________________  Maple Mirza, PA     GM/MEDQ  D:  03/14/2006  T:  03/14/2006  Job:  [id number]   cc:   [name], MD,FACC  Duke Salvia, MD, [facility]  Dr. Weyman Pedro

## 2010-11-25 NOTE — Assessment & Plan Note (Signed)
[facility]                                   ON-CALL NOTE   NAKISHA, CHAI                   MRN:          [id number]  DATE:03/05/2006                            DOB:          1928/09/23    SUPERVISING PHYSICIAN:  Dr. Jens Som.   PRIMARY CARDIOLOGIST:  [name] in [place].   SUMMARY OF HISTORY:  Alona Bene with [employer] called stating that Ms. Mele had  an approximately 4-second pause.  She states that the monitor that Ms. Mcray  is wearing is a self-activating monitor.  On callback, the patient is not  having any problems or difficulty.   I asked [employer] to fax [place] at the hospital the copy of her event.  It does  appear that she has had a significant pause.  Underlying rhythm shows a  sinus brady with a first-degree AV block and intraventricular conduction  delay.  I attempted to call Ms. Poole back on two occasion, however she  never answered her phone.   On Monday morning, I contacted the [facility] office and asked them to pull her  chart to review the strip with Dr. Andee Lineman.  I spoke with Babs Sciara, and she  states that she will do so.                                   Joellyn Rued, PA-C   EW/MedQ  DD:  03/05/2006  DT:  03/05/2006  Job #:  [id number]

## 2010-11-25 NOTE — Assessment & Plan Note (Signed)
[facility]                            [name] NOTE   [name] [name], [name] [name]                   MRN:          [id number]  DATE:04/06/2006                            DOB:          1929/06/04    PRIMARY CARDIOLOGIST:  Dr. Lewayne Bunting.   PRIMARY ELECTROPHYSIOLOGIST:  Dr. Sherryl Manges.   REASON FOR VISIT:  [name] presents for post-hospital followup.   The patient is a 77 year old female, with complex medical history well-known  to Dr. Lewayne Bunting, who was recently hospitalized at [facility] for further  management of atrial fibrillation with rate refractory to medical therapy.  She had also been found to have significant pauses by recent [employer]  monitoring (4.5 seconds) consistent with tachy-brady syndrome.  Sh was  initially started on Tikosyn for rate control, but this was discontinued  secondary to QTC prolongation.  She was also tried on amiodarone, but this  was discontinued secondary to a finding of mild pulmonary fibrosis on CT  scan.   The patient underwent successful placement of a St. Jude dual-chamber  pacemaker by Dr. Sherryl Manges on August 29.   Following discharge, however, the patient developed dizziness/pre-syncope  secondary to hypotension requiring re-hospitalization here at [facility].  She  was seen in consultation on September 7 by Dr. Andee Lineman and was noted to have  blood pressure reading of 80/40 prior to admission.   The patient was treated with gentle hydration and cessation of her diuretic  and lisinopril.   Dr. Andee Lineman recommended increasing Cardizem to 240 q. day to be given in the  evening in addition to atenolol 100 q. day to be given in the morning to  minimize the development of hypotension.  Dr. Andee Lineman did note, however, that  if medical therapy failed in maintaining adequate rate control that the  patient would ultimately require AV nodal ablation.   A 2D echocardiogram was done revealing an EF  of 45-55%; no WMAs; no evidence  of pericardial effusion.   The patient now presents in followup.  Her medications have once again been  further adjusted, again secondary to associated hypotension.  She is  currently on half a dose of Cartia (120) and down to 25 mg of atenolol a  day.   The patient continues to have occasional tachy palpitations, although these  appear to be much less so than in the recent past.  However, she is  symptomatic with these.  She has also been monitoring her blood pressure  closely and reports readings of 95-104 systolic earlier this morning with  associated pulse in the 50-65 range.  She states that she takes her Cartia  at noon and her atenolol in the evening.   The patient presented to the [facility] on September 19 for  pacer interrogation; the incision site was clean and dry.  Of note, 13 mode  switches were noted (35% total time) but no adjustment of her pacer  parameters were recommended.   The patient also remained on the [employer] monitor for a short period  following her recent release from [facility]  This  was reviewed here  in the office today by Dr. Andee Lineman and did reveal episodes of increased heart  rate to as high as 150 BPM.   CURRENT MEDICATIONS:  1. Digoxin 0.125 q. day.  2. Levoxyl 0.075 q. day.  3. Cartia XT 120 q. day.  4. Atenolol 25 q. day.  5. Coumadin 5 mg as directed.      a.     Followed by Dr. Weyman Pedro.   PHYSICAL EXAMINATION:  Blood pressure 124/82, pulse 107, irregular, weight  224.  Electrocardiogram today revealed atrial fibrillation at 107 BPM with  borderline wide QRS.  NECK:  Palpable carotid pulses; no JVD.  LUNGS:  Diminished breath sounds at the bases, but otherwise clear.  HEART:  Irregularly irregular (S1 and S2) with no significant murmurs.  SKIN:  Wound site is stable with no evidence of pocket hematoma, well-healed  incision site with no peri-incisional erythema.  EXTREMITIES:  A  3-4+ bilateral nonpitting lower extremity edema with mild  venous varicosity.   IMPRESSION:  1. Permanent atrial fibrillation with tachy-brady syndrome.      a.     Rate-controlled refractory to multimodal therapy.      b.     Tikosyn intolerant secondary to QT prolongation.      c.     Amiodarone intolerant secondary to mild pulmonary fibrosis.      d.     Status post St. Jude dual-chamber pacemaker implantation March 07, 2006.  2. Chronic Coumadin.  3. Relative hypotension.      a.     Associated pre-syncope requiring recent hospitalization and down-       titration of atrioventricular nodal medications.  4. Mild left ventricular dysfunction.      a.     Ejection fraction 45-55% by recent echocardiogram.  5. History of nonobstructive coronary artery disease.      a.     Cardiac catheterization in 2004.  6. Obstructive sleep apnea.      a.     Noncompliant with continuous positive airway pressure.   PLAN:  1. Up-titrate atenolol from 25 to 75 mg q. day for better rate control.  2. Return clinic visit in 2 Guedes for continued close monitoring of blood      pressure/heart rate.  3. Followup with Dr. Sherryl Manges on December 11, as scheduled.      ______________________________  [name], PA-C    ______________________________  [name], MD,FACC    GS/MedQ  DD:  04/06/2006  DT:  04/08/2006  Job #:  [id number]

## 2010-11-25 NOTE — Assessment & Plan Note (Signed)
[facility]                          [name] NOTE   WILNA, PENNIE                   MRN:          [id number]  DATE:08/09/2006                            DOB:          1928-12-08    REFERRING PHYSICIAN:  Weyman Pedro   HISTORY OF PRESENT ILLNESS:  The patient is a 77 year old female with a  history of tachybrady syndrome and status post pacemaker implantation.  The patient was recently seen by Dr. Eliberto Ivory in the office when she  reported increased dyspnea on minimal exertion.  An abbreviated walk  test was performed by Dr. Eliberto Ivory in the office, and the patient had no  significant desaturation.  She also reported poor appetite and some  weight loss.  __________ symptom, however, shortness of breath, but no  chest pain.  She also denies any orthopnea or  PND.   MEDICATIONS:  1. Digoxin 125 mcg p.o. daily.  2. Levoxyl 75 mg p.o. daily.  3. Cartia 120 mg p.o. daily.  4. Coumadin as directed.  5. Atenolol 50 mg 1-1/2 tablets p.o. q.a.m.  6. Glimepiride 4 mg p.o. daily.  7. Lovastatin 40 mg p.o. daily.  8. Tamoxifen 10 mg p.o. b.i.d.  9. Alprazolam 0.5 mg 1/2 tablet q.p.m.   PHYSICAL EXAMINATION:  VITAL SIGNS:  Blood pressure 120/78.  Heart rate  is 81 beats per minute.  She weighs 212 pounds.  NECK EXAM:  Normal carotid upstroke.  No carotid bruits.  LUNGS:  Clear breath sounds bilaterally with no crackles and no definite  wheezing.  HEART:  Regular rate and rhythm.  Normal S1.  Paradoxical split 2nd  heart sound.  ABDOMEN:  Soft and non-tender.  No rebound or guarding.  Good bowel  sounds.  EXTREMITY EXAM:  No edema.  Twelve lead electrocardiogram:  Atrial fibrillation with ventricular  demand pacing.   PROBLEM LIST:  1. Atrial fibrillation with tachybrady syndrome.      a.     Intolerant to antiarrhythmic drug therapy previously       including Tykosyn and amiodarone.      b.     Status post dual-chamber pacemaker  implanted March 07, 2006.      c.     Aortic valve sequential pacing during last office visit on       April 23, 2006.  2. Chronic Coumadin therapy.  3. Mild left ventricular dysfunction, ejection fraction 45% to 50%.  4. History of nonobstructive coronary artery disease by      catheterization in 2004.  5. Obstructive sleep apnea, noncompliant with CPAP.  6. Breast cancer on tamoxifen therapy.   PLAN:  1. The patient's dyspnea could be secondary to pacemaker syndrome,      rapid atrial fibrillation or __________ AV sequential pacing with      permanent atrial fibrillation.  2. Patient has been scheduled for pacemaker interrogation,      particularly to rule out if she has significant tachyarrhythmias,      which could be contributing to her symptoms.  3. In the interim, I have increased the  patient's Atenolol to 100 mg      p.o. daily, and also increased her digoxin to 250 mcg p.o. daily.  4. The patient will have laboratory work done including a BNP and      BMET.  5. Cardiolite study has been requested to make sure she does not have      underlying coronary artery disease.  6. A 2D echocardiogram has been ordered to reevaluate the patient's      ejection fraction.  7. Chest x-ray, PA and lateral has also been ordered, given the      patient's history of breast cancer.  8. If the patient's dyspnea is due to paroxysmal atrial fibrillation      with pacemaker syndrome, an attempt at restoring normal sinus      rhythm may be in order.  It is unfortunate the patient is      intolerant to several antiarrhythmic drugs, as listed above.  We      will await with further recommendations until pacemaker      interrogation has been completed.     [name], MD,FACC  Electronically Signed    GED/MedQ  DD: 08/12/2006  DT: 08/12/2006  Job #: [id number]   cc:   Weyman Pedro

## 2010-11-25 NOTE — Cardiovascular Report (Signed)
[name], [name] [name]            ACCOUNT NO.:  [id number]   MEDICAL RECORD NO.:  [id number]          PATIENT TYPE:  OIB   LOCATION:  1963                         FACILITY:  [facility]   PHYSICIAN:  Rollene Rotunda, MD, FACCDATE OF BIRTH:  1928-10-21   DATE OF PROCEDURE:  DATE OF DISCHARGE:                            CARDIAC CATHETERIZATION   The primary is Dr. Weyman Pedro.  The cardiologist is [name], MD.   PROCEDURE:  Left and right heart catheterization/coronary geography.   INDICATIONS:  Evaluate patient with cardiomyopathy and an abnormal  Cardiolite suggesting an inferolateral and periapical defect.  She has  permanent atrial fibrillation.   PROCEDURE:  Left heart catheterization was performed via the right  femoral artery, right heart catheterization performed via the right  femoral vein.  Both vessels were cannulated using an anterior wall  puncture.  A #4-French arterial sheath and #7-French venous sheath were  inserted via the modified Seldinger technique.  Preformed Judkins and a  pigtail catheter were utilized as well as a Swan-Ganz catheter.  The  patient tolerated the procedure well and left lab in stable condition.   RESULTS:   HEMODYNAMICS:  RA mean 13, RV 19/2, PA 23/8 with a mean of 14, pulmonary  capillary pressure mean 6, LV 122/4, AO 122/95.  Cardiac output/cardiac  index 2.7/1.35.   CORONARIES:  The left main was normal.  The LAD had mid calcification.  There was a long mid 50% stenosis after the first septal perforator.  There were mid luminal irregularities.  The first diagonal was large  with 40% stenosis.  The second diagonal small and normal.  Circumflex in  the AV groove had luminal irregularities.  There was a first obtuse  marginal, which was large and normal.  There was a first posterolateral,  which was large and normal.  A second posterolateral was large and  normal.  Right coronary artery:  The right coronary artery is a dominant  vessel.   There was a long proximal 25% stenosis.  PDA was a large vessel  and normal.  LV:  We did cross the LV for pressures but did not inject  secondary to renal insufficiency.   CONCLUSION:  Nonobstructive coronary plaque.  The right-sided pressures  were not significantly elevated.  She does have a reduced cardiac  output, however.   PLAN:  The patient will have medical management of her cardiac output,  with Dr. Andee Lineman now feels most likely related to her arrhythmia.  May  also be exacerbated by sleep apnea.   Of note, I did use only 25 mL of contrast.  She will be going back in  2days for a basic metabolic profile at Dr. Margarita Mail office.  She will be  instructed to start her Coumadin tonight and will have that checked in 2  days as well  and followed in the [facility].  Please also note that I was  aware that her glomerular filtration rate was 45.  I discussed the small  but real risk of contrast-induced nephrotoxicity with the patient.  Again, we minimized this possibility by using very little contrast  dye  and will follow her creatinine carefully going forward.      Rollene Rotunda, MD, [facility]  Electronically Signed     JH/MEDQ  D:  08/28/2006  T:  08/28/2006  Job:  [id number]   cc:   Garnet Sierras, MD,FACC

## 2010-11-25 NOTE — Letter (Signed)
August 14, 2006    [name], MD, [facility]  [address]  [place], [place] [address]   RE:  DOMINICK, ZERTUCHE  MRN:  [id number]  /  DOB:  02-Nov-1928   Dear Michelle Piper:   Thanks very much for asking me to see [name] [name] about her  shortness of breath.  As you know, she has tachybrady syndrome.  This  has been going on for a couple of Epp.   She feels like her heart has been racing.   Review of her medications demonstrates that you have intercurrently  increased her atenolol from 75 to 100, and increased her Lanoxin from  0.125 to 0.25.  She is on warfarin, lovastatin, Cartia 120, Levoxyl, and  glimepiride.   On examination today, her blood pressure is 133/86, her pulse is 83.  Lungs were clear, heart sounds were irregular, extremities had 1+ edema.   Interrogation of her St. Jude pacemaker demonstrates a P wave of 1.7  with impedance 423; this is atrial fibrillation.  The R wave was 12 with  impedance of 665 and threshold of 1.25 at 0.5 milliseconds.   She has had atrial fibrillation since she was last seen in November.   In addition, it is notable that her ventricular response is really very  rapid so that she has more than 20% or 30% of her heartbeats faster than  110 beats per minute.   IMPRESSION:  1. Atrial fibrillation - persistent, with a rapid ventricular      response.  2. Exercise intolerance.  3. Normal left ventricular function.  4. Coumadin therapy for the above.  5. Tachybrady syndrome status post pacemaker implantation.   Michelle Piper, I think Ms. Kachmar' atrial fibrillation with a rapid ventricular  response is contributing to her symptoms.  I think the most effective  initial thing to do would be to try cardioversion.  In the event that  this is not effective, two options present themselves.  One would  consideration of antiarrhythmic drug therapy and the other would be AV  junction ablation.  With her normal LV function I think that that would  be a very  reasonable initial thing to try as well.   I will look forward to talking with you about her.   Thanks very much for asking [place] to see her.    Sincerely,      Duke Salvia, MD, [facility]  Electronically Signed    SCK/MedQ  DD: 08/14/2006  DT: 08/14/2006  Job #: [id number]

## 2010-11-25 NOTE — Assessment & Plan Note (Signed)
[facility]                          [name]   NAME:[name] [name], [name] [name]                   MRN:          [id number]  DATE:10/01/2006                            DOB:          02/27/1929    HISTORY OF PRESENT ILLNESS:  The patient is a 77 year old female with a  history of non-obstructive coronary artery disease. The patient has  recently been diagnosed with decreased exercise tolerance and increased  shortness of breath. She was found to be in recurrent atrial  fibrillation. In the interim, she has undergone cardioversion. This was  initially successful, but unfortunately today the patient has reverted  back into atrial fibrillation. Heart rate does appear to be controlled.  Fortunately, clinically, the patient actually states that she is doing  quite well. She is NYHA Class II. She is clearly less short of breath  than when we last saw her. She does have LV dysfunction with an ejection  fraction of 35%, which was felt to be possibly tachycardia induced  cardiomyopathy. The patient underwent cardiac catheterization which  showed no definite significant obstructive coronary artery disease.   The patient reports no orthopnea, PND, palpitations, or syncope. Does  report some increased in lower extremity edema.   MEDICATIONS:  1. Levoxyl 75 mcg p.o. daily.  2. Cartia 120 daily.  3. Coumadin as directed.  4. Glimepiride 4 mg p.o. daily.  5. Lovastatin 40 mg p.o. daily.  6. Tamoxifen 10 b.i.d.  7. Alprazolam p.r.n.  8. Atenolol 100 daily.  9. Digoxin 0.25 mcg p.o. daily.  10.Lisinopril 5 mg p.o. daily.   PHYSICAL EXAMINATION:  VITAL SIGNS: Blood pressure 114/76. Heart rate  72. Weight is 204 pounds.  NECK: Normal carotid upstroke. No carotid bruits.  LUNGS:  Clear breath sounds bilaterally.  HEART: Irregular rate and rhythm. Normal S1, S2.  ABDOMEN: Soft.  EXTREMITIES: 1+ to 2+ peripheral pitting edema.   PROBLEM LIST:  1.  Cardiomyopathy (non-ischemic).      a.     Ejection fraction of 35%.      b.     Rule out tachycardia induced cardiomyopathy.      c.     Non-obstructive coronary artery disease by catheterization.      d.     False positive adenosine Cardiolite study.  2. Permanent atrial fibrillation with tachybrady syndrome.      a.     Recurrent atrial fibrillation post-cardioversion.      b.     Intolerant to antiarrhythmic drug therapy including Tikosyn,       amiodarone.      c.     Status post [facility] Dual pacemaker implantation.  3. Chronic Coumadin therapy.  4. Type 2 diabetes mellitus.  5. Hypothyroidism.  6. Lower extremity edema.  7. History of pulmonary nodule.  8. Renal insufficiency, creatinine of 1.5.   PLAN:  1. The patient does have increase in lower extremity edema, but      clinically she is actually less short of breath. We will add Lasix      20 mg p.o. daily to her medical regimen.  2. The patient has now reverted back into atrial fibrillation. I do      not think any further consideration of cardioversion is      appropriate. Also, the patient is not a candidate for      antiarrhythmic drug therapy as she is unable to tolerate Tikosyn      and amiodarone. Given the lack of significant coronary artery      disease, consideration could be given to a Type 1C drug. However, I      am quite reluctant about this as the patient did have an LAD lesion      of 50% as well as a large diagonal of 40% stenosis. Also,      Cardiolite was not normal and in retrospect it is very hard to know      if this does not represent a degree of ischemic heart disease.      Furthermore, her ejection fraction is also abnormal with mitigates      against using a Type 1C antiarrhythmic drug.  3. The patient will have next week pacemaker interrogated and we will      review the histograms of the pacemaker. If the patient has frequent      rapid bursts of atrial fibrillation, she will need to be  considered      for AV nodal ablation as our medical therapy is maximal at the      present time.     [name], MD,FACC  Electronically Signed    GED/MedQ  DD: 10/01/2006  DT: 10/01/2006  Job #: [id number]   cc:   Weyman Pedro

## 2010-11-25 NOTE — Assessment & Plan Note (Signed)
[facility]                          [name]   [name] [name], [name] [name]                   MRN:          [id number]  DATE:08/23/2006                            DOB:          03-01-1929    PRIMARY CARDIOLOGIST:  [name], M.D.   PRIMARY ELECTROPHYSIOLOGIST:  Duke Salvia, M.D.   REASON FOR VISIT:  Scheduled clinic followup.   Please refer to Dr. Margarita Mail complete office note of January 31 for full  details.   Since last seen here in the clinic, the patient has had an evaluation by  Dr. Sherryl Manges on February 5 including interrogation of her St. Jude  pacemaker.  He noted that she has had atrial fibrillation since last  seen in November 2007.  He noted rapid ventricular response (20-30% of  heartbeats greater than 110 BPM) and concluded that this could very well  be contributing to her symptoms.  He recommended initial treatment with  cardioversion and, if this fails, to consider either antiarrhythmic drug  therapy versus AV nodal ablation.   Of note, however, the patient has had extensive workup per Dr. Margarita Mail  recommendations consisting of a 2-D echocardiogram for reassessment of  LV function and a stress Cardiolite to exclude coronary artery disease  progression.  The 2-D echocardiogram shows a change in LV function with  decrease from a previous EF approximately 45% in September 2007 to a  current estimate of approximately 30%.  There was also suggestion of  wall motion abnormalities.  Perfusion imaging, also reviewed by Dr.  Andee Lineman, revealed a large inferolateral defect as well as a periapical  defect; calculated ejection fraction 44%.   Of note, previous cardiac catheterization August 2004 suggested  noncritical CAD with moderate LAD and diagonal disease and normal left  ventricular function (EF 63%).  Medical therapy was recommended at that  time.   The patient also had extensive blood work notable for BUN  22, creatinine  1.5, and potassium 4.7.  TSH 2.40.  BNP 341.  Stable  hemoglobin/hematocrit with marginally elevated wbc's.   The patient also had a chest x-ray which showed interval development of  a 9-mm nodular density at the level of T6-7 intervertebral foramen.  It  was noted that the patient has prior history of left breast  carcinoma/mastectomy and a followup CT scan of the thorax within 4 [place]  was highly suggested.   From a clinical standpoint, the patient states that she has noticed some  mild but palpable improvement in her overall exercise tolerance and  associated dyspnea since up-titration of both her atenolol and digoxin  by Dr. Andee Lineman.  For example, she can still climb a flight of stairs but  has moderate dyspnea upon completion.  She denies any orthopnea or  paroxysmal nocturnal dyspnea and has chronic marked bilateral lower  extremity edema with no recent exacerbation.  Her weight is essentially  unchanged.   CURRENT MEDICATIONS:  1. Levoxyl 0.075 daily.  2. Cardia 120 daily.  3. Coumadin 5 mg as directed.  4. Glimepiride 4 mg daily.  5. Lovastatin 40 mg daily.  6.  Tamoxifen 10 mg b.i.d.  7. Alprazolam 0.25 mg q.p.m.  8. Atenolol 100 mg daily.  9. Digoxin 0.25 mg daily.   PHYSICAL EXAMINATION:  VITAL SIGNS:  Blood pressure 127/82, pulse 70 and  regular, weight 213.  Electrocardiogram today reveals ventricular pacing  at approximately 70 BPM with underlying atrial fibrillation.  GENERAL:  A 77 year old female sitting upright in no apparent distress.  HEENT:  Normocephalic, atraumatic.  NECK:  Palpable bilateral carotid pulses without bruits.  LUNGS:  Clear to auscultation in all fields.  HEART:  Regular rate and rhythm (S1, S2).  No significant murmurs.  ABDOMEN:  Soft, nontender, intact bowel sounds.  EXTREMITIES:  Nonpalpable posterior tibialis pulses with 3-4+ bilateral  nonpitting lower extremity edema.  NEUROLOGIC:  No focal deficit.   IMPRESSION:   1. Cardiomyopathy.      a.     Ejection fraction 30% by recent 2-D echocardiogram (45-50%       by 2-D echocardiogram September 2007).      b.     Noncritical coronary artery disease by cardiac       catheterization August 2004; ejection fraction 63%.      c.     Current abnormal adenosine Cardiolite revealing a large       inferolateral/periapical defect; EF 44%.  2. Permanent atrial fibrillation with tachybrady syndrome.      a.     With uncontrolled ventricular response.      b.     Intolerant to antiarrhythmic therapy previously including       Tikosyn and amiodarone.      c.     Status post St. Jude dual-chamber pacemaker implantation       August 2007.  3. Chronic Coumadin, INR today 2.7.  4. Obstructive sleep apnea, noncompliant with CPAP.  5. Type 2 diabetes mellitus.  6. Hypothyroidism.  7. Chronic marked bilateral lower extremity edema.  8. Pulmonary nodule (9 mm).  9. Renal insufficiency.   PLAN:  1. Following review with Dr. Andee Lineman, recommendation is to proceed with      further evaluation with a diagnostic right/left cardiac      catheterization to exclude significant coronary artery disease      progression.  This will be of coronaries only, given the patient's      underlying renal insufficiency.  2. Hold Coumadin as of today and schedule catheterization early next      week in the [facility].  The patient will need a followup repeat      protime on Monday.  3. Check a followup BMET as well as digoxin level today.  4. If the catheterization is negative for significant coronary artery      disease, then plan is to have the patient return back here to the      clinic for early followup.  At that time we will arrange for her to      proceed with TEE-guided cardioversion.      [name] Serpe, PA-C  Electronically Signed      [name], MD,FACC  Electronically Signed  GS/MedQ  DD: 08/23/2006  DT: 08/23/2006  Job #: [id number]   cc:   Weyman Pedro

## 2010-11-25 NOTE — Cardiovascular Report (Signed)
NAME:  JOHNANNA, [name] [name] NO.:  [id number]   MEDICAL RECORD NO.:  [id number]                   PATIENT TYPE:  OIB   LOCATION:  2899                                 FACILITY:  [facility]   PHYSICIAN:  Jonelle Sidle, M.D. [facility]        DATE OF BIRTH:  19-May-1929   DATE OF PROCEDURE:  02/26/2003  DATE OF DISCHARGE:                              CARDIAC CATHETERIZATION   PRIMARY CARE PHYSICIAN:  Dr.  Weyman Pedro in [place].   CARDIOLOGIST:  Jonelle Sidle, M.D. [facility]   INDICATION:  Ms. Heyne is a 77 year old woman with recently diagnosed  paroxysmal atrial fibrillation.  She has had a recent abnormal Cardiolite  showing an ejection fraction of 48% with possible anteroapical and inferior  hypokinesis as well as an anteroapical mild reversible defect.  She has  underlying left bundle branch block.  She is now in normal sinus rhythm  being managed with Coumadin and amiodarone.  Her Coumadin has been held and  she is now referred for coronary angiography to clearly outline the coronary  anatomy and help guide further therapy.   PROCEDURES PERFORMED:  1. Left heart catheterization.  2. Selective coronary angiography.  3. Left ventriculography.   ACCESS AND EQUIPMENT:  The area about the right femoral artery was  anesthetized with 1% lidocaine and a 6-French sheath was placed in the right  femoral artery via the modified Seldinger technique.  Standard preform 6-  [place] JL-4 and JR-4 catheter was used for selective coronary angiography  and an angled pigtail catheter was used for left heart catheterization and  left ventriculography.  All exchanges were made over wire and the patient  tolerated the procedure well without immediate complications.   HEMODYNAMICS:  1. Left ventricle 161/17 mmHg.  2. Aorta 162/79 mmHg.   ANGIOGRAPHIC FINDINGS:  1. Left main coronary artery is free of significant flow limiting coronary     atherosclerosis.  2. The left anterior  descending is a large caliber vessel with two diagonal     branches.  There is a 40-50% mid vessel stenosis.  Just prior to this is     a branching diagonal that has a 70-80% proximal stenosis.  Flow is TIMI-3     throughout.  3. The circumflex coronary artery is a large vessel with no flow liming     coronary atherosclerosis noted.  4. The right coronary artery is a medium caliber vessel with a 20% proximal     stenosis and other minor luminal irregularities.  This is a dominant     vessel.   Left ventriculography was performed in the RAO projection revealing an  ejection fraction of 63% with a slight area of anteroapical hypokinesis, but  essentially preserved contraction at the apex.  There is no significant  mitral regurgitation.   DIAGNOSES:  1. Moderate left anterior descending and diagonal disease as outlined.  2. Left ventricular ejection fraction calculated at 63% with a slight  area     of anteroapical hypokinesis but relatively preserved contraction at the     apex proper.  No significant mitral regurgitation is noted.   PLAN:  At this point, will continue medical therapy and close observation.                                                   Jonelle Sidle, M.D. [facility]    SGM/MEDQ  D:  02/26/2003  T:  02/26/2003  Job:  [id number]

## 2010-11-25 NOTE — Assessment & Plan Note (Signed)
[facility]                         ELECTROPHYSIOLOGY OFFICE NOTE   SHAILEE, FOOTS                   MRN:          [id number]  DATE:06/19/2006                            DOB:          [birth year]    Mrs. Newbrough is seen following pacer implantation for tachycardia-  bradycardia syndrome. She thinks she is doing very well without  significant symptoms.   MEDICATIONS:  Notable for the absence of antiarrhythmic drugs; they have  been tried and failed.  She is on rate control with atenolol 75 a day  and Lanoxin 0.125, Cartia 120, and Coumadin.   PHYSICAL EXAMINATION:  VITAL SIGNS: Her blood pressure today was 136/74,  pulse 58.  LUNGS:  Were clear.  CARDIAC: Heart sounds were regular.  EXTREMITIES: Without edema.   Interrogation of her St. Jude [id number] device demonstrated P wave of  5, impedance of 402, threshold 0.75 to 0.5.  The R wave was 11.7 with  impedance of 721 and threshold of 1 volt at 0.5.  For 34% of the time,  she is in atrial fibrillation with a mildly rapid ventricular response.   IMPRESSION:  1. Tachycardia-bradycardia syndrome.  2. Paroxysmal atrial fibrillation.  3. Status post pacer for the bradycardia.   I have suggested that she can increase her atenolol p.r.n. dose at 50  mg.   I will see her again in 9 months' time.     Duke Salvia, MD, [facility]  Electronically Signed    SCK/MedQ  DD: 06/19/2006  DT: 06/19/2006  Job #: [id number]   cc:   Willeen Niece

## 2010-11-25 NOTE — Assessment & Plan Note (Signed)
[facility]                            [name] NOTE   [name] [name], [name] [name]                   MRN:          [id number]  DATE:04/23/2006                            DOB:          Sep 15, 1928    REFERRING PHYSICIAN:  Weyman Pedro, M.D.   HISTORY OF PRESENT ILLNESS:  The patient is a 77 year old female with a  history of tachy-brady syndrome status post pacemaker implantation.  The  patient was seen by Gene in the office on September 28.  She still  complained of some palpitations.  She was also recently hospitalized with  hypotension related to her medical therapy.  We have increased her atenolol  from 25 to 75 mg a day.  She states that she has been doing very well.  She  has had no palpitations or presyncope.  She states that she feels much  improved.  She has no chest pain.  She is also thinking about a plan to  start taking tamoxifen for a history of breast cancer.   MEDICATIONS:  1. Digoxin 125 mcg p.o. daily.  2. Levoxyl 75 mg daily.  3. Cardia XD 120 mg p.o. daily.  4. Coumadin as directed.  5. Cephalexin 500 mg p.o. t.i.d.  6. Atenolol 50 mg 1-1/2 tablets p.o. every morning.    Blood pressure 138/72. Heart rate is 60 beats a minute.  HEENT: Pupils are isocoric.  Nose and mouth clear.  NECK:  Supple.  No carotid bruits.  LUNGS:  Clear.  HEART:  Regular rate and rhythm and normal sinus.  ABDOMEN:  Soft.  EXTREMITIES:  No edema.   Twelve lead EKG without magnet, AV sequential paced rhythm.  With magnet, AV  sequential rhythm with magnet rate at 98 beats per minute.   PROBLEM:  1. Hypotension, resolved.  2. Permanent atrial fibrillation with tachy-brady syndrome.      a.     Rate controlled.      b.     __________ intolerance secondary to QT prolongation.      c.     Amiodarone intolerance secondary to mild pulmonary fibrosis.      d.     Status post Saint Jude dual chamber pacemaker implant March 07, 2006.  3. Chronic Coumadin.  4. Mild LV dysfunction.      a.     Ejection fraction 45% to 55%.  5. History of nonobstructive coronary artery disease.  (Catheterization      2004).  6. Obstructive sleep apnea. (Non-compliant with the CPAP).  7. History of breast cancer tamoxifen medical therapy recommended by Dr.      Gabriel Cirri.   PLAN:  1. From a cardiovascular perspective, patient is doing quite well.  Her      heart rate appears to be well controlled on current medical therapy.  2. I have made no changes in the patient's medical regimen.  She can      follow up with [place] in 6 months.  3. The patient is being treated for urinary tract infection with      antibiotics  and we will check a PT, INR in the office today.  Dr.      Eliberto Ivory has already adjusted the patient's Coumadin for the next couple      of days.       [name], MD,FACC     GED/MedQ  DD:  04/23/2006  DT:  04/24/2006  Job #:  [id number]   cc:   Weyman Pedro

## 2010-11-25 NOTE — Op Note (Signed)
[name], [name] [name]            ACCOUNT NO.:  [id number]   MEDICAL RECORD NO.:  [id number]          PATIENT TYPE:  INP   LOCATION:  3312                         FACILITY:  [facility]   PHYSICIAN:  Duke Salvia, MD, FACCDATE OF BIRTH:  09/23/1928   DATE OF PROCEDURE:  03/07/2006  DATE OF DISCHARGE:                                 OPERATIVE REPORT   PREOPERATIVE DIAGNOSIS:  Tachybrady syndrome.   POSTOPERATIVE DIAGNOSIS:  Tachybrady syndrome.   PROCEDURE:  Dual-chamber pacemaker implantation.   Following obtaining informed consent, the patient was brought to the  Electrophysiology Laboratory and placed on the fluoroscopic table in the  supine position.  After routine prep and drape of the right upper chest,  lidocaine was infiltrated in the prepectoral subclavicular region.  An  incision was made in the prepectoral groove and carried down to the layer of  the prepectoral fascia using electrocautery and sharp dissection.  A pocket  was formed similarly.  Hemostasis was obtained.   Thereafter, attention was turned to gaining access to the extrathoracic  right subclavian vein, which was accomplished with modest difficulty.  The  artery was punctured on 1 occasion.  Pressure was held for 3 minutes.  Two  separate venipunctures were accomplished.  Guidewires were placed and  retained, and a 0-silk suture was placed in a figure-of-eight fashion and  allowed to hang loosely.   Sequentially, 7-French sheaths were placed, over which was passed a St. Jude  1688-TC, 52-cm active fixation ventricular lead, serial number [id number],  and a 1688-TC active fixation atrial lead, serial number [id number], under  fluoroscopic guidance.  I initially attempted to put the right ventricular  lead on the septum, but could not do that, so we ended up putting it in the  right ventricular apex, where the bipolar R wave was 11.4 with impedance of  1404 ohms with threshold of 1.5 volts at 0.5 milliseconds.   Current  threshold was 1.0 mA, and the bipolar fibrillation wave was 2.3 millivolts  with a pacing impedance of 675 ohms.  With these successful parameters  recorded, the leads were secured to the prepectoral fascia and attached to a  St. Jude 5816 dual-chamber pacemaker, serial number [id number].  Mode switch  occurred.  The pocket was copiously irrigated with antibiotic-containing  saline solution, hemostasis was assured, and the leads and the pulse  generator were placed in the pocket, secured to the prepectoral fascia.  The  wound was then closed in 3 layers in the normal fashion.  The wound was  washed, dried, and a benzoin and Steri-Strips dressing was applied.  Needle  counts, sponge counts, and instrument counts were  correct at the end of the procedure according to the staff.  The patient  tolerated the procedure without apparent complication.   Fluoroscopy time was 3.37 minutes.           ______________________________  Duke Salvia, MD, [facility]     SCK/MEDQ  D:  03/07/2006  T:  03/08/2006  Job:  [id number]   cc:   Electrophysiology Laboratory  [facility]  Elby Beck, MD

## 2010-11-25 NOTE — Discharge Summary (Signed)
[name], [name] [name]            ACCOUNT NO.:  [id number]   MEDICAL RECORD NO.:  [id number]          PATIENT TYPE:  INP   LOCATION:  4741                         FACILITY:  [facility]   PHYSICIAN:  Duke Salvia, MD, FACCDATE OF BIRTH:  02-Oct-1928   DATE OF ADMISSION:  03/06/2006  DATE OF DISCHARGE:                                 DISCHARGE SUMMARY   ADDENDUM:  This involves the diagnosis of a gout flare of the right hallux.  The patient has been treated here at [facility] immediately with colchicine,  will go home with allopurinol 100 mg daily for a 2-week period as  prophylaxis against its return or its development.  This can be addressed  when she sees Dr. Eliberto Ivory in follow-up.     ______________________________  Maple Mirza, PA    ______________________________  Duke Salvia, MD, [facility]    GM/MEDQ  D:  03/14/2006  T:  03/14/2006  Job:  [id number]   cc:   [name], MD,FACC  Weyman Pedro, M.D.

## 2010-12-18 ENCOUNTER — Emergency Department (HOSPITAL_COMMUNITY): Payer: Medicare Other

## 2010-12-18 ENCOUNTER — Emergency Department (HOSPITAL_COMMUNITY)
Admission: EM | Admit: 2010-12-18 | Discharge: 2010-12-18 | Disposition: A | Discharge status: Home / Self Care | Payer: Medicare Other | Attending: Emergency Medicine | Admitting: Emergency Medicine

## 2010-12-18 ENCOUNTER — Encounter (HOSPITAL_COMMUNITY): Payer: Self-pay | Admitting: Radiology

## 2010-12-18 DIAGNOSIS — E119 Type 2 diabetes mellitus without complications: Secondary | ICD-10-CM | POA: Insufficient documentation

## 2010-12-18 DIAGNOSIS — Z79899 Other long term (current) drug therapy: Secondary | ICD-10-CM | POA: Insufficient documentation

## 2010-12-18 DIAGNOSIS — R0602 Shortness of breath: Secondary | ICD-10-CM | POA: Insufficient documentation

## 2010-12-18 DIAGNOSIS — E7800 Pure hypercholesterolemia, unspecified: Secondary | ICD-10-CM | POA: Insufficient documentation

## 2010-12-18 DIAGNOSIS — E039 Hypothyroidism, unspecified: Secondary | ICD-10-CM | POA: Insufficient documentation

## 2010-12-18 DIAGNOSIS — I517 Cardiomegaly: Secondary | ICD-10-CM | POA: Insufficient documentation

## 2010-12-18 DIAGNOSIS — M7989 Other specified soft tissue disorders: Secondary | ICD-10-CM | POA: Insufficient documentation

## 2010-12-18 DIAGNOSIS — L02419 Cutaneous abscess of limb, unspecified: Secondary | ICD-10-CM | POA: Insufficient documentation

## 2010-12-18 LAB — COMPREHENSIVE METABOLIC PANEL
ALT: 14 U/L (ref 0–35)
Alkaline Phosphatase: 73 U/L (ref 39–117)
CO2: 28 mEq/L (ref 19–32)
GFR calc Af Amer: 56 mL/min — ABNORMAL LOW (ref >60)
GFR calc non Af Amer: 46 mL/min — ABNORMAL LOW (ref >60)
Glucose, Bld: 138 mg/dL — ABNORMAL HIGH (ref 70–99)
Potassium: 3.4 mEq/L — ABNORMAL LOW (ref 3.5–5.1)
Sodium: 141 mEq/L (ref 135–145)
Total Bilirubin: 0.7 mg/dL (ref 0.3–1.2)

## 2010-12-18 LAB — URINALYSIS, ROUTINE W REFLEX MICROSCOPIC
Bilirubin Urine: NEGATIVE
Hgb urine dipstick: NEGATIVE
Nitrite: NEGATIVE
Specific Gravity, Urine: 1.01 (ref 1.005–1.030)
Urobilinogen, UA: 0.2 mg/dL (ref 0.0–1.0)
pH: 6.5 (ref 5.0–8.0)

## 2010-12-18 LAB — DIFFERENTIAL
Basophils Absolute: 0.1 10*3/uL (ref 0.0–0.1)
Basophils Relative: 1 % (ref 0–1)
Lymphocytes Relative: 38 % (ref 12–46)
Neutro Abs: 3.3 10*3/uL (ref 1.7–7.7)
Neutrophils Relative %: 48 % (ref 43–77)

## 2010-12-18 LAB — CBC
HCT: 43.2 % (ref 36.0–46.0)
Hemoglobin: 14.2 g/dL (ref 12.0–15.0)
RBC: 4.68 MIL/uL (ref 3.87–5.11)

## 2010-12-18 LAB — D-DIMER, QUANTITATIVE: D-Dimer, Quant: 0.61 ug/mL-FEU — ABNORMAL HIGH (ref 0.00–0.48)

## 2010-12-18 MED ORDER — IOHEXOL 350 MG/ML SOLN
100.0000 mL | Freq: Once | INTRAVENOUS | Status: AC | PRN
  Administered 2010-12-18: 100 mL via INTRAVENOUS

## 2010-12-19 ENCOUNTER — Other Ambulatory Visit (HOSPITAL_COMMUNITY): Payer: 59

## 2010-12-19 ENCOUNTER — Ambulatory Visit (HOSPITAL_COMMUNITY)
Admission: RE | Admit: 2010-12-19 | Discharge: 2010-12-19 | Disposition: A | Discharge status: Home / Self Care | Payer: Medicare Other | Source: Ambulatory Visit | Attending: Emergency Medicine | Admitting: Emergency Medicine

## 2010-12-19 ENCOUNTER — Other Ambulatory Visit (HOSPITAL_COMMUNITY): Payer: Self-pay | Admitting: Emergency Medicine

## 2010-12-19 DIAGNOSIS — M7989 Other specified soft tissue disorders: Secondary | ICD-10-CM | POA: Insufficient documentation

## 2010-12-19 DIAGNOSIS — M79609 Pain in unspecified limb: Secondary | ICD-10-CM | POA: Insufficient documentation

## 2010-12-19 DIAGNOSIS — R52 Pain, unspecified: Secondary | ICD-10-CM

## 2010-12-20 DIAGNOSIS — I059 Rheumatic mitral valve disease, unspecified: Secondary | ICD-10-CM

## 2010-12-20 DIAGNOSIS — I509 Heart failure, unspecified: Secondary | ICD-10-CM

## 2010-12-20 LAB — URINE CULTURE

## 2011-01-24 ENCOUNTER — Encounter: Payer: Self-pay | Admitting: Internal Medicine

## 2011-01-24 DIAGNOSIS — I495 Sick sinus syndrome: Secondary | ICD-10-CM

## 2011-03-15 ENCOUNTER — Encounter: Payer: Self-pay | Admitting: *Deleted

## 2011-03-31 LAB — DIFFERENTIAL
Basophils Relative: 1
Eosinophils Absolute: 0.1
Eosinophils Relative: 1
Monocytes Absolute: 0.6
Monocytes Relative: 6
Neutrophils Relative %: 55

## 2011-03-31 LAB — COMPREHENSIVE METABOLIC PANEL
ALT: 12
Alkaline Phosphatase: 52
Glucose, Bld: 75
Potassium: 3.9
Sodium: 141
Total Protein: 6.7

## 2011-03-31 LAB — CBC
Hemoglobin: 15.3 — ABNORMAL HIGH
RBC: 5.04
RDW: 14.3

## 2011-03-31 LAB — PROTIME-INR: INR: 1

## 2011-04-20 LAB — BASIC METABOLIC PANEL
BUN: 27 — ABNORMAL HIGH
CO2: 26
CO2: 27
Chloride: 100
Chloride: 104
Creatinine, Ser: 1.27 — ABNORMAL HIGH
Glucose, Bld: 194 — ABNORMAL HIGH
Potassium: 3.9
Potassium: 4.3
Sodium: 138

## 2011-04-20 LAB — CBC
HCT: 44.1
HCT: 44.6
Hemoglobin: 14.9
MCHC: 33.3
MCHC: 33.3
MCV: 93.1
MCV: 93.2
Platelets: 191
RDW: 14.3 — ABNORMAL HIGH

## 2011-04-20 LAB — PROTIME-INR
INR: 1.7 — ABNORMAL HIGH
Prothrombin Time: 25.3 — ABNORMAL HIGH

## 2011-04-27 ENCOUNTER — Encounter: Payer: Self-pay | Admitting: Internal Medicine

## 2011-04-27 DIAGNOSIS — I495 Sick sinus syndrome: Secondary | ICD-10-CM

## 2011-06-21 ENCOUNTER — Encounter: Payer: Self-pay | Admitting: *Deleted

## 2011-06-23 ENCOUNTER — Ambulatory Visit (INDEPENDENT_AMBULATORY_CARE_PROVIDER_SITE_OTHER): Payer: Medicare Other | Admitting: Internal Medicine

## 2011-06-23 ENCOUNTER — Encounter: Payer: Self-pay | Admitting: Internal Medicine

## 2011-06-23 DIAGNOSIS — I428 Other cardiomyopathies: Secondary | ICD-10-CM

## 2011-06-23 DIAGNOSIS — I442 Atrioventricular block, complete: Secondary | ICD-10-CM

## 2011-06-23 DIAGNOSIS — I4891 Unspecified atrial fibrillation: Secondary | ICD-10-CM

## 2011-06-23 LAB — PACEMAKER DEVICE OBSERVATION
BATTERY VOLTAGE: 2.74 V
BRDY-0002RV: 70 {beats}/min
BRDY-0004RV: 110 {beats}/min
RV LEAD THRESHOLD: 0.875 V

## 2011-06-23 NOTE — Progress Notes (Signed)
PCP:  Kirstie Peri, MD  The patient presents today for routine electrophysiology followup.  Since last being seen in our clinic, the patient reports doing very well.  Today, she denies symptoms of palpitations, chest pain, shortness of breath, orthopnea, PND, lower extremity edema, dizziness, presyncope, syncope, or neurologic sequela.  The patient feels that she is tolerating medications without difficulties and is otherwise without complaint today.   Past Medical History  Diagnosis Date  . Diabetes mellitus   . Hypertension   . Cancer     bilateral breast ca/ mastectomies only  . Permanent atrial fibrillation     Status post atrioventricular nodal ablation/St. Jude dual  chamber pacemaker implantation., chronic coumadin therapy followed by Dr. Sherryll Burger  . Nonischemic cardiomyopathy     Question tachycardia induced. Ejection fraction 30-40% with global hypokinesis on May 8,2008. Ejection fraction 40-50% by 2-D echo on November 2008.    [name] Hypothyroidism     Treated hypothyroidism with normal free T4 and TSH now and chronic  . Renal insufficiency   . OSA (obstructive sleep apnea)   . Lower extremity edema     chronic which is nonpitting  . History of solitary pulmonary nodule     resolved by chest x-ray in September 2008  . Encounter for long-term (current) use of anticoagulants    Past Surgical History  Procedure Date  . Pacemaker placement     History of right ventricular pacemaker with lead thrombus.Negative blood cultures with addition of aspirin to coumadin. No embolic events.     Current Outpatient Prescriptions  Medication Sig Dispense Refill  . ALPRAZolam (XANAX) 0.5 MG tablet Take 0.5 mg by mouth as needed.        . Amlodipine-Valsartan-HCTZ 5-160-12.5 MG TABS Take by mouth.        [name] aspirin 81 MG tablet Take 81 mg by mouth daily.        [name] atorvastatin (LIPITOR) 20 MG tablet Take 20 mg by mouth daily.        . cetirizine (ZYRTEC) 10 MG tablet Take 10 mg by mouth daily.          . furosemide (LASIX) 40 MG tablet Take 40 mg by mouth daily.        [name] glimepiride (AMARYL) 4 MG tablet Take 4 mg by mouth 2 (two) times daily.        [name] levothyroxine (SYNTHROID, LEVOTHROID) 75 MCG tablet Take 75 mcg by mouth daily.        [name] warfarin (COUMADIN) 5 MG tablet Take 5 mg by mouth daily. Managed by Dr. Sherryll Burger         Allergies  Allergen Reactions  . Codeine   . Nitrofurantoin     History   Social History  . Marital Status: Widowed    Spouse Name: N/A    Number of Children: N/A  . Years of Education: N/A   Occupational History  . Not on file.   Social History Main Topics  . Smoking status: Never Smoker   . Smokeless tobacco: Never Used  . Alcohol Use: Not on file  . Drug Use: Not on file  . Sexually Active: Not on file   Other Topics Concern  . Not on file   Social History Narrative  . No narrative on file    Physical Exam: Filed Vitals:   06/23/11 1440  BP: 123/82  Pulse: 78  Height: 5\' 4"  (1.626 m)    GEN- The patient is well appearing, alert and oriented  x 3 today.   Head- normocephalic, atraumatic Eyes-  Sclera clear, conjunctiva pink Ears- hearing intact Oropharynx- clear Neck- supple, no JVP Lymph- no cervical lymphadenopathy Lungs- Clear to ausculation bilaterally, normal work of breathing Chest- pacemaker pocket is well healed Heart- Regular rate and rhythm, no murmurs, rubs or gallops, PMI not laterally displaced GI- soft, NT, ND, + BS Extremities- no clubbing, cyanosis, or edema  Pacemaker interrogation- reviewed in detail today,  See PACEART report  Assessment and Plan:

## 2011-06-23 NOTE — Patient Instructions (Signed)
Your physician (Dr. Johney Frame) recommends that you to follow up in 1 year. You will receive a reminder letter in the mail one-two months in advance. If you don't receive a letter, please call our office to schedule the follow-up appointment. Follow up as scheduled with Dr. Earnestine Leys.

## 2011-06-23 NOTE — Assessment & Plan Note (Signed)
Normal pacemaker function See Pace Art report No changes today  

## 2011-06-23 NOTE — Assessment & Plan Note (Signed)
Permanent afib  S/p AVN ablation Goal INR 2-3

## 2011-06-30 ENCOUNTER — Encounter: Payer: Self-pay | Admitting: Internal Medicine

## 2011-09-22 ENCOUNTER — Encounter: Payer: Self-pay | Admitting: Internal Medicine

## 2011-09-22 DIAGNOSIS — I495 Sick sinus syndrome: Secondary | ICD-10-CM

## 2011-09-28 ENCOUNTER — Encounter: Payer: Self-pay | Admitting: Cardiology

## 2011-09-28 ENCOUNTER — Ambulatory Visit (INDEPENDENT_AMBULATORY_CARE_PROVIDER_SITE_OTHER): Payer: Medicare Other | Admitting: Cardiology

## 2011-09-28 VITALS — BP 130/84 | HR 69 | Ht 64.0 in | Wt 228.0 lb

## 2011-09-28 DIAGNOSIS — Z9889 Other specified postprocedural states: Secondary | ICD-10-CM | POA: Insufficient documentation

## 2011-09-28 DIAGNOSIS — Z950 Presence of cardiac pacemaker: Secondary | ICD-10-CM | POA: Insufficient documentation

## 2011-09-28 DIAGNOSIS — I4891 Unspecified atrial fibrillation: Secondary | ICD-10-CM

## 2011-09-28 NOTE — Progress Notes (Signed)
[name] Bottoms, MD, [facility] ABIM Board Certified in Adult Cardiovascular Medicine,Internal Medicine and Critical Care Medicine    CC: Followup patient nonischemic myopathy  HPI:  The patient is an elderly female with a history of nonischemic cardiomyopathy. She status post AV nodal ablation and pacemaker implantation. She has been doing well. Apparently she had an admission several months ago for some fluid overload. She has minimal edema. She denies any shortness of breath chest pain orthopnea PND. She reports no palpitations. She remains in permanent atrial fibrillation and is tolerating Coumadin without any difficulty she reports no bleeding complications. She reports no presyncope or syncope. She is also followed by Dr. Johney Frame in EP clinic.   PMH: reviewed and listed in Problem List in Electronic Records (and see below) Past Medical History  Diagnosis Date  . Diabetes mellitus   . Hypertension   . Cancer     bilateral breast ca/ mastectomies only  . Permanent atrial fibrillation     Status post atrioventricular nodal ablation/St. Jude dual  chamber pacemaker implantation., chronic coumadin therapy followed by Dr. Sherryll Burger  . Nonischemic cardiomyopathy     Question tachycardia induced. Ejection fraction 30-40% with global hypokinesis on May 8,2008. Ejection fraction 40-50% by 2-D echo on November 2008.    [name] Hypothyroidism     Treated hypothyroidism with normal free T4 and TSH now and chronic  . Renal insufficiency   . OSA (obstructive sleep apnea)   . Lower extremity edema     chronic which is nonpitting  . History of solitary pulmonary nodule     resolved by chest x-ray in September 2008  . Encounter for long-term (current) use of anticoagulants    Past Surgical History  Procedure Date  . Pacemaker placement     History of right ventricular pacemaker with lead thrombus.Negative blood cultures with addition of aspirin to coumadin. No embolic events.     Allergies/SH/FHX :  available in Electronic Records for review  Allergies  Allergen Reactions  . Codeine   . Nitrofurantoin    History   Social History  . Marital Status: Widowed    Spouse Name: N/A    Number of Children: N/A  . Years of Education: N/A   Occupational History  . Not on file.   Social History Main Topics  . Smoking status: Never Smoker   . Smokeless tobacco: Never Used  . Alcohol Use: Not on file  . Drug Use: Not on file  . Sexually Active: Not on file   Other Topics Concern  . Not on file   Social History Narrative  . No narrative on file   No family history on file.  Medications: Current Outpatient Prescriptions  Medication Sig Dispense Refill  . ALPRAZolam (XANAX) 0.5 MG tablet Take 0.5 mg by mouth as needed.        . Amlodipine-Valsartan-HCTZ 5-160-12.5 MG TABS Take by mouth.        [name] aspirin 81 MG tablet Take 81 mg by mouth daily.        [name] atorvastatin (LIPITOR) 20 MG tablet Take 20 mg by mouth daily.        . cetirizine (ZYRTEC) 10 MG tablet Take 10 mg by mouth daily.        . furosemide (LASIX) 40 MG tablet Take 40 mg by mouth daily.        [name] glimepiride (AMARYL) 4 MG tablet Take 4 mg by mouth 2 (two) times daily.        [name]  levothyroxine (SYNTHROID, LEVOTHROID) 75 MCG tablet Take 75 mcg by mouth daily.        [name] warfarin (COUMADIN) 5 MG tablet Take 5 mg by mouth daily. Managed by Dr. Sherryll Burger         ROS: No nausea or vomiting. No fever or chills.No melena or hematochezia.No bleeding.No claudication  Physical Exam: BP 130/84  Pulse 69  Ht 5\' 4"  (1.626 m)  Wt 228 lb (103.42 kg)  BMI 39.14 kg/m2 General: Well-nourished white female in no distress Neck: Normal carotid upstroke no carotid bruits. No thyromegaly nonnodular thyroid JVP 6 cm Lungs: Clear breath sounds bilaterally no wheezing Cardiac: Regular rate and rhythm with normal S1-S2/paradoxically split second heart sound Vascular: No edema. Normal distal pulses Skin: Warm and dry Physcologic: Normal  affect  12lead ECG: Ventricular pacing with underlying atrial fibrillation Limited bedside ECHO:N/A No images are attached to the encounter.    Patient Active Problem List  Diagnoses  . DIABETES MELLITUS, TYPE II  . DYSLIPIDEMIA  . OBSTRUCTIVE SLEEP APNEA  . HYPERTENSION  . CARDIOMYOPATHY, DILATED  . Permanent atrial fibrillation  . LEFT VENTRICULAR FUNCTION, DECREASED  . PULMONARY NODULE  . RENAL INSUFFICIENCY, CHRONIC  . Pacemaker  . S/P AV nodal ablation    PLAN   Continue current medical therapy. No change in Lasix dosing.  Followup the EP service for pacemaker implantation.  Monitor weight.  I encouraged patient to remain active and ejected therapeutic last changes.

## 2011-09-28 NOTE — Patient Instructions (Signed)
Continue all current medications. Your physician wants you to follow up in: 6 months.  You will receive a reminder letter in the mail one-two months in advance.  If you don't receive a letter, please call our office to schedule the follow up appointment   

## 2011-12-25 DIAGNOSIS — I495 Sick sinus syndrome: Secondary | ICD-10-CM

## 2012-03-28 DIAGNOSIS — I495 Sick sinus syndrome: Secondary | ICD-10-CM

## 2012-04-18 ENCOUNTER — Encounter: Payer: Self-pay | Admitting: Cardiology

## 2012-04-18 ENCOUNTER — Ambulatory Visit (INDEPENDENT_AMBULATORY_CARE_PROVIDER_SITE_OTHER): Payer: Medicare Other | Admitting: Cardiology

## 2012-04-18 VITALS — BP 148/89 | HR 77 | Ht 64.0 in | Wt 227.0 lb

## 2012-04-18 DIAGNOSIS — Z950 Presence of cardiac pacemaker: Secondary | ICD-10-CM

## 2012-04-18 DIAGNOSIS — I10 Essential (primary) hypertension: Secondary | ICD-10-CM

## 2012-04-18 DIAGNOSIS — I4821 Permanent atrial fibrillation: Secondary | ICD-10-CM

## 2012-04-18 DIAGNOSIS — I4891 Unspecified atrial fibrillation: Secondary | ICD-10-CM

## 2012-04-18 NOTE — Progress Notes (Signed)
Clinical Summary Ms. Frazee is a 78 y.o.female presenting for followup. She is a former patient of Dr. Andee Lineman, last seen in the office in March. She states that she has been doing very well. She reports no angina symptoms or progressive shortness of breath. Dr. Sherryll Burger follows her Coumadin, she denies any bleeding problems.  Echocardiogram from 3/12 showed LVEF 50-55% with inferolateral hypokinesis and septal motion consistent with pacing, MAC with no significant mitral regurgitation, RVSP 25-30 mm mercury, mildly dilated ascending aorta.  I reviewed her cardiac medications. ECG also reviewed showing a ventricular paced rhythm with probable underlying atrial fibrillation.   Allergies  Allergen Reactions  . Codeine   . Nitrofurantoin     Current Outpatient Prescriptions  Medication Sig Dispense Refill  . ALPRAZolam (XANAX) 0.5 MG tablet Take 0.5 mg by mouth as needed.        [name] aspirin 81 MG tablet Take 162 mg by mouth daily.       [name] atorvastatin (LIPITOR) 20 MG tablet Take 20 mg by mouth daily.        . cetirizine (ZYRTEC) 10 MG tablet Take 10 mg by mouth daily.        . furosemide (LASIX) 40 MG tablet Take 40 mg by mouth daily.        [name] glimepiride (AMARYL) 4 MG tablet Take 4 mg by mouth 2 (two) times daily.        [name] levothyroxine (SYNTHROID, LEVOTHROID) 75 MCG tablet Take 75 mcg by mouth daily.        . metFORMIN (GLUCOPHAGE) 500 MG tablet Take 1 tablet by mouth Twice daily.      . valsartan-hydrochlorothiazide (DIOVAN-HCT) 160-12.5 MG per tablet Take 1 capsule by mouth Daily.      [name] warfarin (COUMADIN) 5 MG tablet Take 5 mg by mouth daily. Managed by Dr. Sherryll Burger         Past Medical History  Diagnosis Date  . Diabetes mellitus   . Hypertension   . Cancer     bilateral breast ca/ mastectomies only  . Permanent atrial fibrillation     Status post atrioventricular nodal ablation/St. Jude dual  chamber pacemaker implantation., chronic coumadin therapy followed by Dr. Sherryll Burger  .  Nonischemic cardiomyopathy     Question tachycardia induced. Ejection fraction 30-40% with global hypokinesis on May 8,2008. Ejection fraction 40-50% by 2-D echo on November 2008.    [name] Hypothyroidism     Treated hypothyroidism with normal free T4 and TSH now and chronic  . Renal insufficiency   . OSA (obstructive sleep apnea)   . Lower extremity edema     chronic which is nonpitting  . History of solitary pulmonary nodule     resolved by chest x-ray in September 2008  . Long term (current) use of anticoagulants     Social History Ms. Schuld reports that she has never smoked. She has never used smokeless tobacco. Ms. Sandberg has no alcohol history on file.  Review of Systems Otherwise negative.  Physical Examination Filed Vitals:   04/18/12 1412  BP: 148/89  Pulse: 77   Filed Weights   04/18/12 1359  Weight: 227 lb (102.967 kg)   No acute distress. HEENT: Conjunctiva and lids normal, oropharynx clear. Neck: Supple, no elevated JVP or carotid bruits, no thyromegaly. Lungs: Clear to auscultation, nonlabored breathing at rest. Cardiac: Regular rate and rhythm, soft systolic murmur, no pericardial rub. Abdomen: Soft, nontender, bowel sounds present. Extremities: No pitting edema, distal pulses 2+.  Problem List and Plan   Permanent atrial fibrillation She continues on Coumadin under the direction of Dr. Sherryll Burger. She is status post AV nodal ablation with pacemaker in place. No changes made today. Echocardiogram from last year reviewed showing LVEF of 50-55%.  Pacemaker Keep regular followup with Dr. Johney Frame.  HYPERTENSION Blood pressure mildly elevated today, typically much better control by her account. I asked her to keep an eye on this and followup with Dr. Sherryll Burger.    Jonelle Sidle, M.D., F.A.C.C.

## 2012-04-18 NOTE — Assessment & Plan Note (Signed)
Blood pressure mildly elevated today, typically much better control by her account. I asked her to keep an eye on this and followup with Dr. Sherryll Burger.

## 2012-04-18 NOTE — Assessment & Plan Note (Signed)
Keep regular followup with Dr. Allred. 

## 2012-04-18 NOTE — Assessment & Plan Note (Addendum)
She continues on Coumadin under the direction of Dr. Sherryll Burger. She is status post AV nodal ablation with pacemaker in place. No changes made today. Echocardiogram from last year reviewed showing LVEF of 50-55%.

## 2012-04-18 NOTE — Patient Instructions (Addendum)

## 2012-05-07 IMAGING — CR DG CHEST 2V
2 series · 2 of 2 positions shown · non-contrast
Comparison: 08/20/2007

CLINICAL DATA: Preoperative respiratory examination for right knee
surgery.

CHEST - 2 VIEW

[w chest pa]
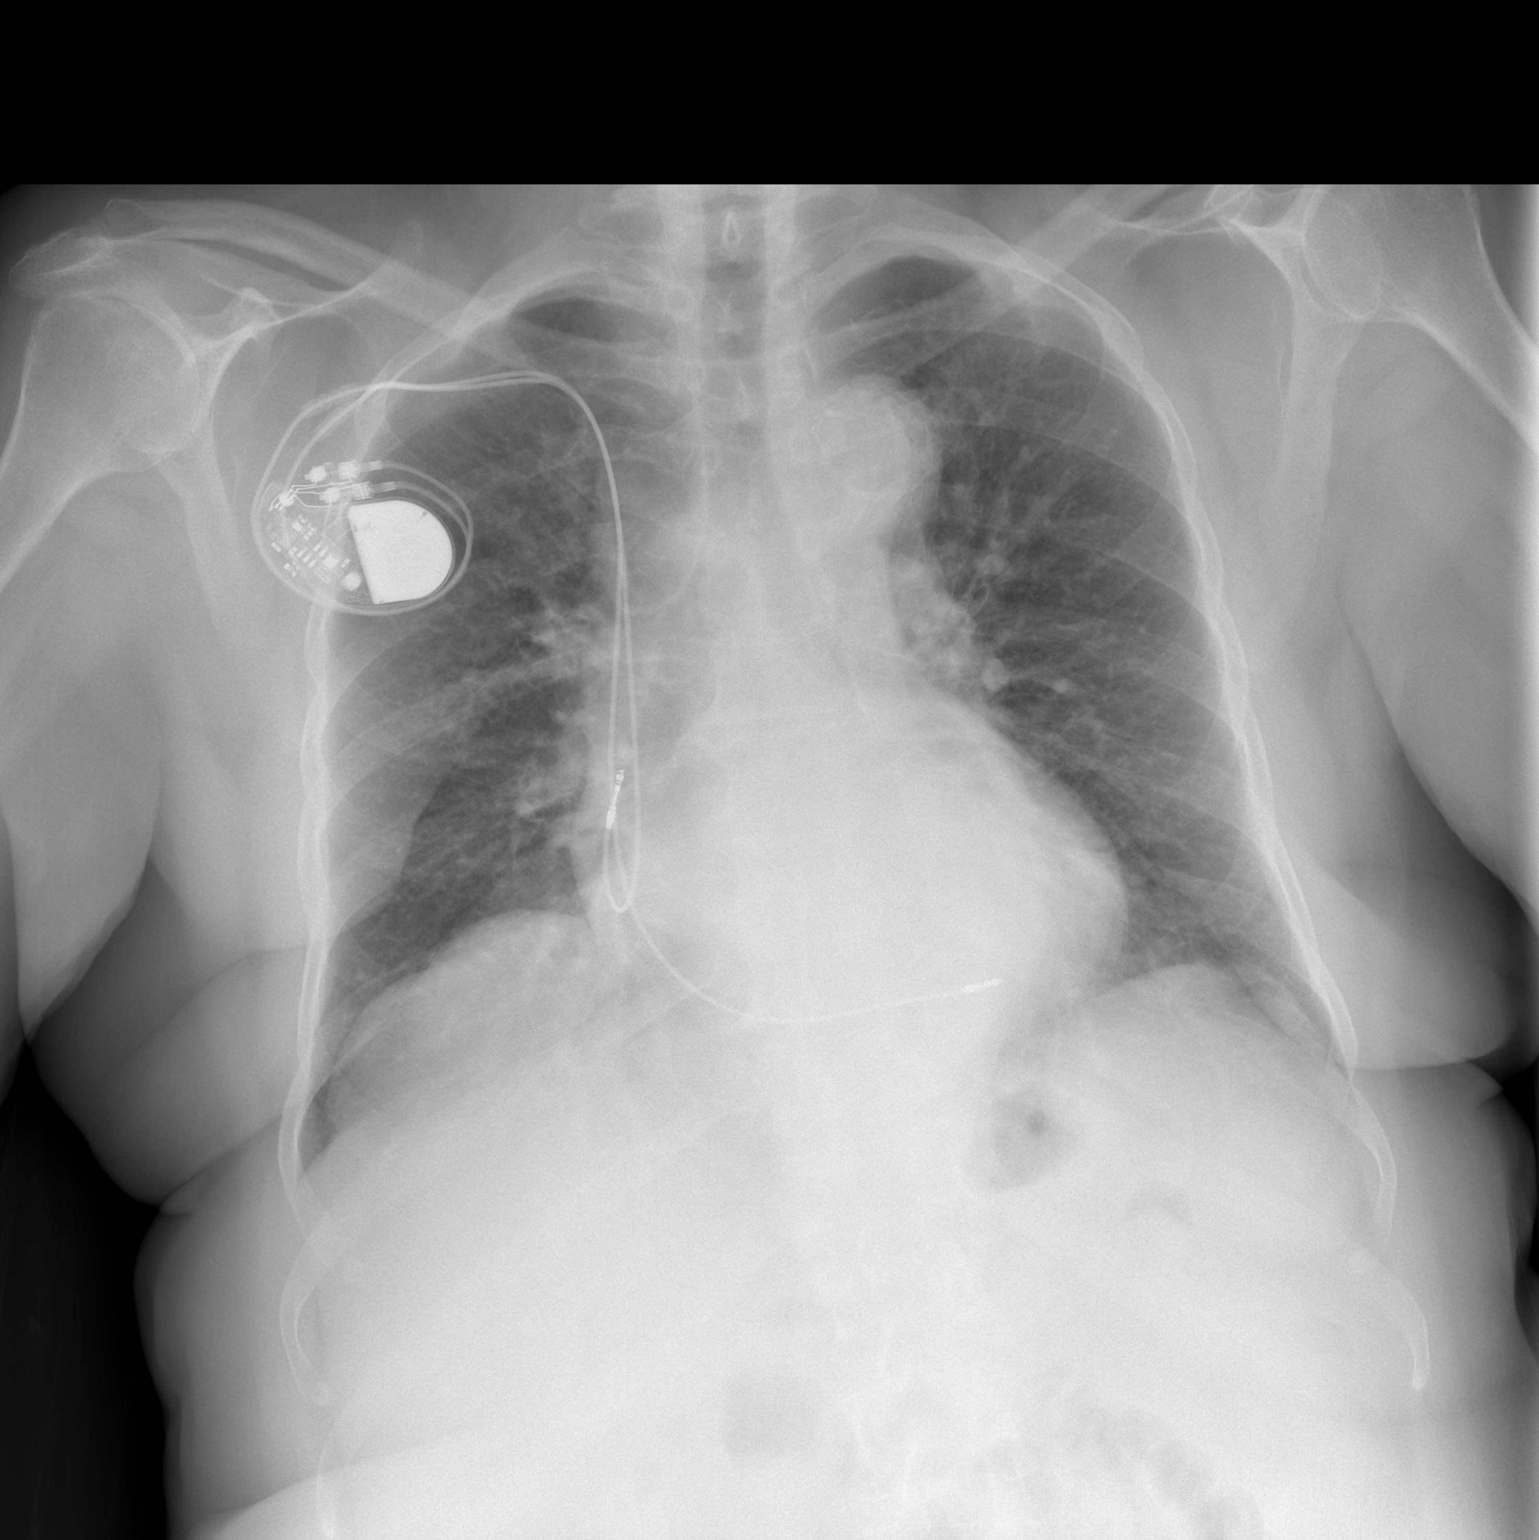

[w chest lat]
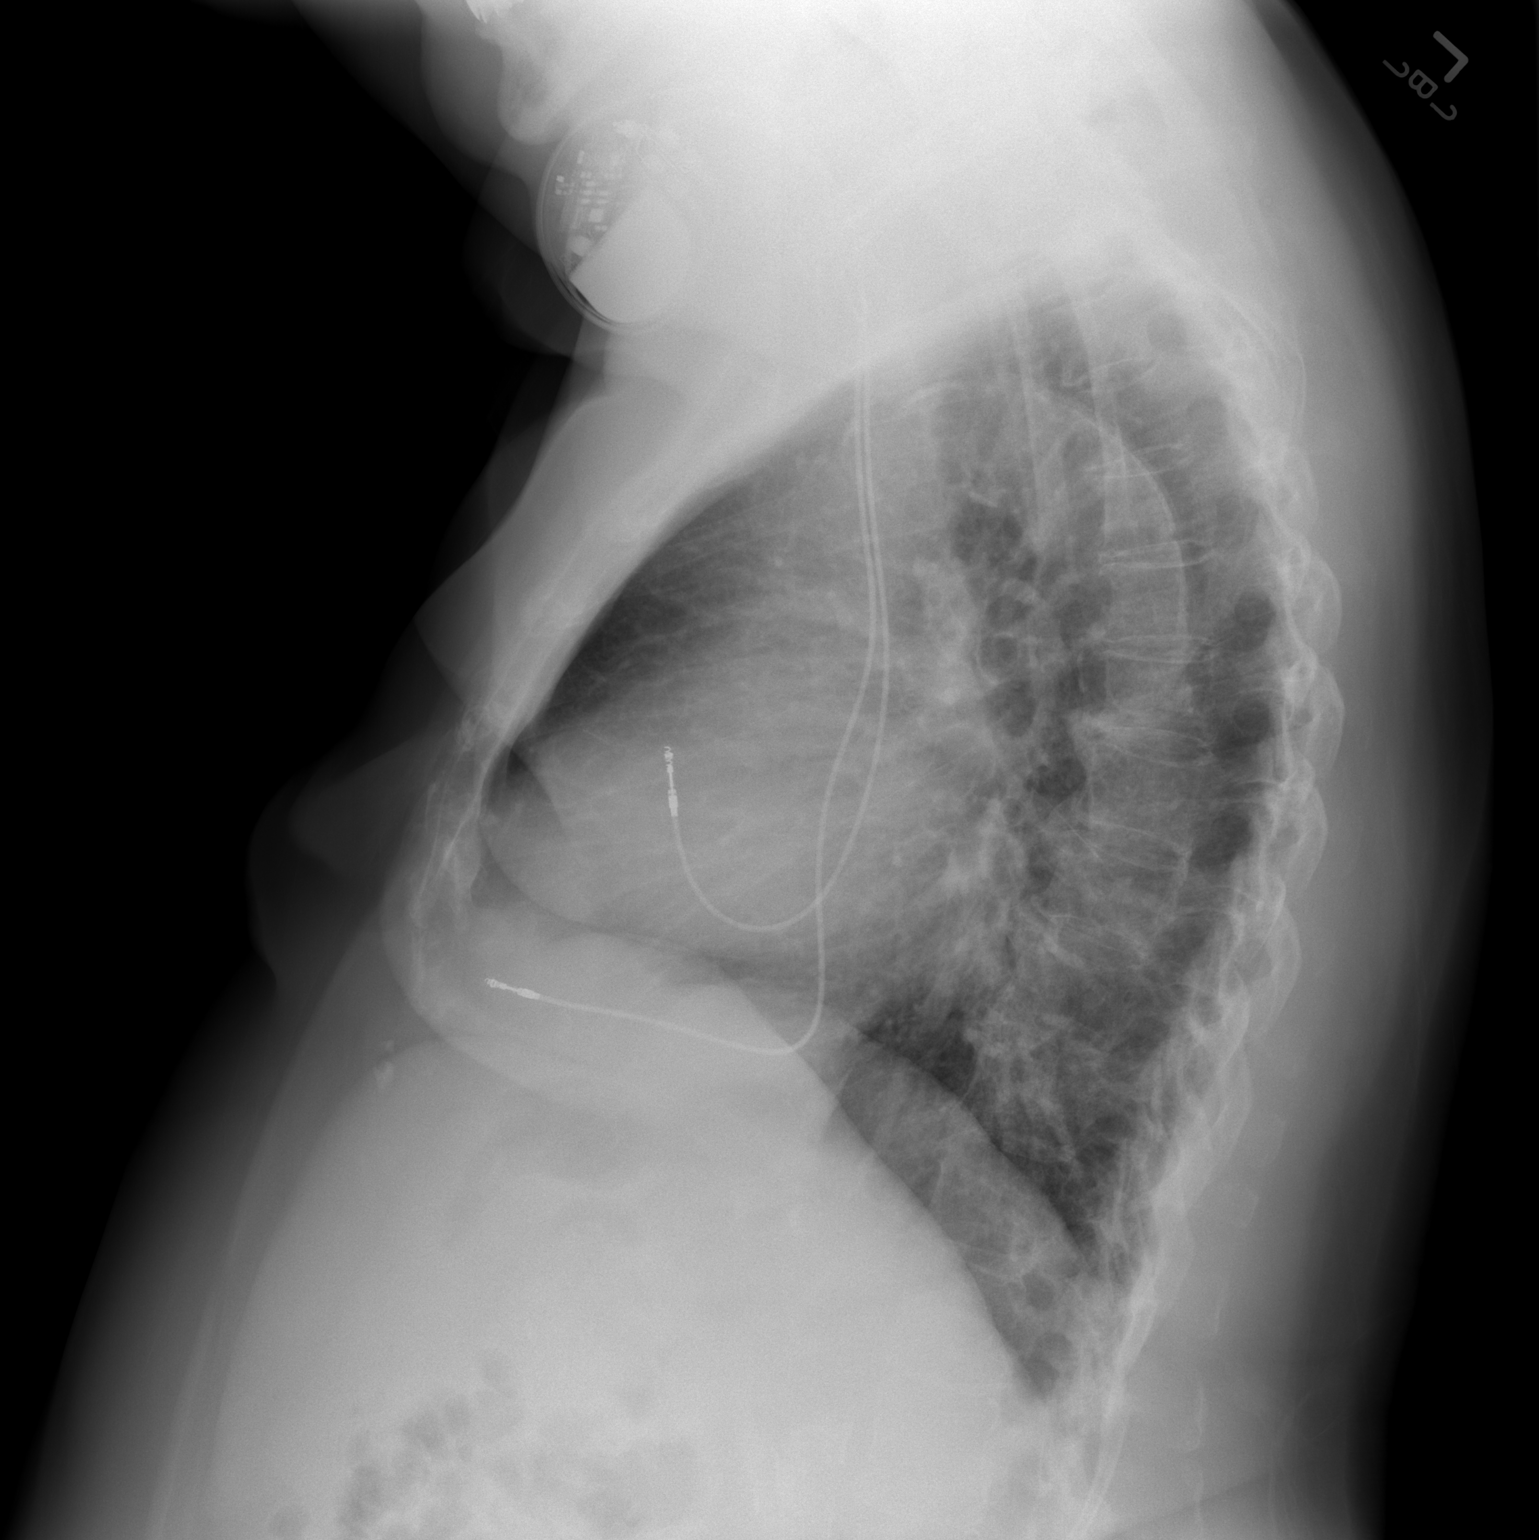

[2 of 2 positions shown; findings below may reference images not displayed]

FINDINGS: Mild cardiomegaly and a right-sided pacemaker again
noted.
Mild peribronchial thickening is stable.
There is no evidence of focal airspace disease, pulmonary edema,
pleural effusion, or pneumothorax.
No acute bony abnormalities are identified.
IMPRESSION: Mild cardiomegaly without evidence of active cardiopulmonary
disease.

## 2012-06-20 ENCOUNTER — Encounter: Payer: Self-pay | Admitting: *Deleted

## 2012-06-26 ENCOUNTER — Encounter: Payer: Self-pay | Admitting: Internal Medicine

## 2012-06-26 ENCOUNTER — Ambulatory Visit (INDEPENDENT_AMBULATORY_CARE_PROVIDER_SITE_OTHER): Payer: Medicare Other | Admitting: Internal Medicine

## 2012-06-26 VITALS — BP 118/75 | HR 78 | Ht 64.0 in | Wt 224.0 lb

## 2012-06-26 DIAGNOSIS — Z9889 Other specified postprocedural states: Secondary | ICD-10-CM

## 2012-06-26 DIAGNOSIS — I4821 Permanent atrial fibrillation: Secondary | ICD-10-CM

## 2012-06-26 DIAGNOSIS — I4891 Unspecified atrial fibrillation: Secondary | ICD-10-CM

## 2012-06-26 DIAGNOSIS — I10 Essential (primary) hypertension: Secondary | ICD-10-CM

## 2012-06-26 DIAGNOSIS — Z950 Presence of cardiac pacemaker: Secondary | ICD-10-CM

## 2012-06-26 DIAGNOSIS — I442 Atrioventricular block, complete: Secondary | ICD-10-CM

## 2012-06-26 LAB — PACEMAKER DEVICE OBSERVATION
BATTERY VOLTAGE: 2.73 V
BRDY-0002RV: 70 {beats}/min
BRDY-0004RV: 110 {beats}/min
DEVICE MODEL PM: 1754260
RV LEAD THRESHOLD: 0.75 V

## 2012-06-26 NOTE — Patient Instructions (Signed)
Continue all current medications. Allred - 1 year  

## 2012-06-26 NOTE — Progress Notes (Signed)
ELECTROPHYSIOLOGY OFFICE NOTE  Patient ID: [name] [name] MRN: [id number], DOB/AGE: March 25, 1929   Date of Visit: 06/26/2012  Primary Physician: Kirstie Peri, MD Primary Cardiologist: Diona Browner, MD Reason for Visit: EP/device follow-up  History of Present Illness  [name] [name] presents today for routine electrophysiology followup. Since last being seen in our clinic, she reports doing well. Today, she denies symptoms of palpitations, chest pain, shortness of breath, orthopnea, PND, lower extremity edema, dizziness, presyncope or syncope. She feels that she is tolerating medications without difficulty and is without complaint today.   Past Medical History Past Medical History  Diagnosis Date  . Diabetes mellitus   . Hypertension   . Cancer     bilateral breast ca/ mastectomies only  . Permanent atrial fibrillation     Status post atrioventricular nodal ablation/St. Jude dual  chamber pacemaker implantation., chronic coumadin therapy followed by Dr. Sherryll Burger  . Nonischemic cardiomyopathy     Question tachycardia induced. Ejection fraction 30-40% with global hypokinesis on May 8,2008. Ejection fraction 40-50% by 2-D echo on November 2008.    [name] [name] Hypothyroidism     Treated hypothyroidism with normal free T4 and TSH now and chronic  . Renal insufficiency   . OSA (obstructive sleep apnea)   . Lower extremity edema     chronic which is nonpitting  . History of solitary pulmonary nodule     resolved by chest x-ray in September 2008  . Long term (current) use of anticoagulants     Past Surgical History Past Surgical History  Procedure Date  . Pacemaker placement     History of right ventricular pacemaker with lead thrombus.Negative blood cultures with addition of aspirin to coumadin. No embolic events.      Allergies/Intolerances Allergies  Allergen Reactions  . Codeine   . Nitrofurantoin     Current Home Medications Current Outpatient Prescriptions  Medication Sig Dispense Refill    . ALPRAZolam (XANAX) 0.5 MG tablet Take 0.5 mg by mouth as needed.        [name] [name] aspirin 81 MG tablet Take 162 mg by mouth daily.       [name] [name] atorvastatin (LIPITOR) 20 MG tablet Take 20 mg by mouth daily.        . cetirizine (ZYRTEC) 10 MG tablet Take 10 mg by mouth daily.        . cyclobenzaprine (FLEXERIL) 10 MG tablet Take 10 mg by mouth 3 (three) times daily as needed.       . furosemide (LASIX) 40 MG tablet Take 40 mg by mouth daily.        [name] [name] glimepiride (AMARYL) 4 MG tablet Take 4 mg by mouth 2 (two) times daily.        [name] [name] levothyroxine (SYNTHROID, LEVOTHROID) 75 MCG tablet Take 75 mcg by mouth daily.        . metFORMIN (GLUCOPHAGE) 500 MG tablet Take 1 tablet by mouth Twice daily.      . valsartan-hydrochlorothiazide (DIOVAN-HCT) 160-12.5 MG per tablet Take 1 capsule by mouth Daily.      [name] [name] warfarin (COUMADIN) 5 MG tablet Take 5 mg by mouth daily. Managed by Dr. Sherryll Burger         Social History Social History  . Marital Status: Widowed   Social History Main Topics  . Smoking status: Never Smoker   . Smokeless tobacco: Never Used  . Alcohol Use: Not on file  . Drug Use: Not on file   Review of Systems General: No chills, fever, night sweats or  weight changes Cardiovascular: No chest pain, dyspnea on exertion, edema, orthopnea, palpitations, paroxysmal nocturnal dyspnea Dermatological: No rash, lesions or masses Respiratory: No cough, dyspnea Urologic: No hematuria, dysuria Abdominal: No nausea, vomiting, diarrhea, bright red blood per rectum, melena, or hematemesis Neurologic: No visual changes, weakness, changes in mental status All other systems reviewed and are otherwise negative except as noted above.  Physical Exam Blood pressure 118/75, pulse 78, height 5\' 4"  (1.626 m), weight 224 lb (101.606 kg), SpO2 94.00%.  General: Well developed, well appearing 78 year old female in no acute distress. HEENT: Normocephalic, atraumatic. EOMs intact. Sclera nonicteric. Oropharynx clear.  Neck:  Supple. No JVD. Lungs:  Respirations regular and unlabored, CTA bilaterally. No wheezes, rales or rhonchi. Heart: Regular S1, S2. No murmurs, rub, S3 or S4. Abdomen: Soft, non-distended.  Extremities: No clubbing, cyanosis or edema. DP/PT/Radials 2+ and equal bilaterally. Psych: Normal affect. Neuro: Alert and oriented X 3. Moves all extremities spontaneously.   Diagnostics Device interrogation performed today shows normal VVI pacemaker function with good battery status and stable lead measurements; histograms appropriate; no programming changes made; see PaceArt report  Assessment and Plan 1. Permanent atrial fibrillation s/p AVN ablation, PPM implant - normal device function; histograms appropriate; no programming changes made; continue Merlin remote follow-up every 3 months; follow-up with me in one year 2. HTN - normotensive today; continue current antihypertensive regimen 3. History of NICM - EF normalized by echo March 2012; euvolemic by exam; continue medical therapy; keep routine follow-up with Dr. Diona Browner as scheduled

## 2012-07-17 ENCOUNTER — Telehealth: Payer: Self-pay | Admitting: *Deleted

## 2012-07-17 NOTE — Telephone Encounter (Signed)
Spoke with patient and she wants to cancel all appointments and recalls with Dr. Diona Browner and Allred since she is now following Dr. Andee Lineman.

## 2012-12-18 ENCOUNTER — Encounter: Payer: Self-pay | Admitting: Cardiology

## 2012-12-18 ENCOUNTER — Ambulatory Visit (INDEPENDENT_AMBULATORY_CARE_PROVIDER_SITE_OTHER): Payer: Medicare Other | Admitting: Cardiology

## 2012-12-18 VITALS — BP 106/72 | HR 71 | Ht 64.0 in | Wt 223.0 lb

## 2012-12-18 DIAGNOSIS — I4821 Permanent atrial fibrillation: Secondary | ICD-10-CM

## 2012-12-18 DIAGNOSIS — I10 Essential (primary) hypertension: Secondary | ICD-10-CM

## 2012-12-18 DIAGNOSIS — E785 Hyperlipidemia, unspecified: Secondary | ICD-10-CM

## 2012-12-18 DIAGNOSIS — I4891 Unspecified atrial fibrillation: Secondary | ICD-10-CM

## 2012-12-18 DIAGNOSIS — Z950 Presence of cardiac pacemaker: Secondary | ICD-10-CM

## 2012-12-18 NOTE — Assessment & Plan Note (Signed)
Now reestablishing with our practice, last saw Dr. Andee Lineman in December 2013. Clinically stable, Coumadin followed by Dr. Sherryll Burger.

## 2012-12-18 NOTE — Assessment & Plan Note (Signed)
To followup with Dr. Johney Frame in the device clinic. She is status post AV node ablation.

## 2012-12-18 NOTE — Patient Instructions (Addendum)
Your physician recommends that you schedule a follow-up appointment in: 6 months. You will receive a reminder letter in the mail in about 4 months reminding you to call and schedule your appointment. If you don't receive this letter, please contact our office. Your physician recommends that you continue on your current medications as directed. Please refer to the Current Medication list given to you today. Your physician recommends that you return for a FASTING lipid/CMET profile:in 6 months. You will receive a reminder letter/call in the mail in about 4 months reminding you to have this done before your next appointment. If you don't receive this letter/call, please contact our office. Please schedule a follow up visit with Dr. Johney Frame in December.

## 2012-12-18 NOTE — Progress Notes (Signed)
Clinical Summary [name] [name] is an 79 y.o.female last seen in October 2013. Records indicate a visit with Dr. Andee Lineman with [facility] office in December 2013. She is now reestablishing with our practice.  She reports no major change in her cardiac status, no palpitations or unusual breathlessness. Does tell me incidentally that she had a tick bite recently and was also treated as an outpatient for pneumonia by Dr. Sherryll Burger. Coumadin is also followed by Dr. Sherryll Burger.  Echocardiogram from 3/12 showed LVEF 50-55% with inferolateral hypokinesis and septal motion consistent with pacing, MAC with no significant mitral regurgitation, RVSP 25-30 mm mercury, mildly dilated ascending aorta.  She had bloodwork in November 2013 that showed Hbg 17.3, platelets 239, cholesterol 174, TG 136, HDL 52, LDL 95, TSH 5.3, AST 21, ALT 19, creatinine 1.3, GFR 38., potassium 4.0.     Allergies  Allergen Reactions  . Codeine   . Nitrofurantoin     Current Outpatient Prescriptions  Medication Sig Dispense Refill  . ALPRAZolam (XANAX) 0.5 MG tablet Take 0.5 mg by mouth as needed.        [name] aspirin 81 MG tablet Take 162 mg by mouth daily.       [name] atorvastatin (LIPITOR) 20 MG tablet Take 20 mg by mouth daily.        . cetirizine (ZYRTEC) 10 MG tablet Take 10 mg by mouth daily.        . cyclobenzaprine (FLEXERIL) 10 MG tablet Take 10 mg by mouth 3 (three) times daily as needed.       . furosemide (LASIX) 40 MG tablet Take 40 mg by mouth daily as needed.       [name] glimepiride (AMARYL) 4 MG tablet Take 4 mg by mouth 2 (two) times daily.        [name] KLOR-CON 10 10 MEQ tablet Take 1 tablet by mouth daily as needed.      [name] levothyroxine (SYNTHROID, LEVOTHROID) 75 MCG tablet Take 75 mcg by mouth daily.        . metFORMIN (GLUCOPHAGE) 500 MG tablet Take 1 tablet by mouth Twice daily.      . valsartan-hydrochlorothiazide (DIOVAN-HCT) 160-12.5 MG per tablet Take 1 capsule by mouth Daily.      [name] warfarin (COUMADIN) 5 MG tablet Take 5 mg by mouth  daily. Managed by Dr. Sherryll Burger        No current facility-administered medications for this visit.    Past Medical History  Diagnosis Date  . Type 2 diabetes mellitus   . Essential hypertension, benign   . Breast cancer     Bilateral s/p mastectomies  . Permanent atrial fibrillation     Status post atrioventricular nodal ablation/St. Jude dual  chamber pacemaker implantation., chronic coumadin therapy followed by Dr. Sherryll Burger  . Nonischemic cardiomyopathy     Question tachycardia mediated  . Hypothyroidism   . Renal insufficiency   . OSA (obstructive sleep apnea)     Past Surgical History  Procedure Laterality Date  . Pacemaker placement      St. Jude - follows with Dr. Johney Frame    Social History Ms. Donegan reports that she has never smoked. She has never used smokeless tobacco. Ms. Oelke reports that she does not drink alcohol.  Review of Systems Arthritic pain in her knees.  Negative except as outlined above.  Physical Examination Filed Vitals:   12/18/12 1257  BP: 106/72  Pulse: 71   Filed Weights   12/18/12 1257  Weight: 223 lb (101.152  kg)    Comfortable at rest.  HEENT: Conjunctiva and lids normal, oropharynx clear.  Neck: Supple, no elevated JVP or carotid bruits, no thyromegaly.  Lungs: Clear to auscultation, nonlabored breathing at rest.  Cardiac: Regular rate and rhythm, soft systolic murmur, no pericardial rub.  Abdomen: Soft, nontender, bowel sounds present.  Extremities: No pitting edema, distal pulses 2+. Skin: Warm and dry. Musculoskeletal: No kyphosis. Neuropsychiatric: Alert and oriented x3, talkative.   Problem List and Plan   Permanent atrial fibrillation Now reestablishing with our practice, last saw Dr. Andee Lineman in December 2013. Clinically stable, Coumadin followed by Dr. Sherryll Burger.  Pacemaker-St.Jude To followup with Dr. Johney Frame in the device clinic. She is status post AV node ablation.  HYPERTENSION Blood pressure is normal  today.  DYSLIPIDEMIA LDL 95 in November 2013, tolerating Lipitor, LFTs normal.    Jonelle Sidle, M.D., F.A.C.C.

## 2012-12-18 NOTE — Assessment & Plan Note (Signed)
Blood pressure is normal today. 

## 2012-12-18 NOTE — Assessment & Plan Note (Signed)
LDL 95 in November 2013, tolerating Lipitor, LFTs normal.

## 2013-03-02 ENCOUNTER — Encounter: Payer: Self-pay | Admitting: Internal Medicine

## 2013-03-02 DIAGNOSIS — I495 Sick sinus syndrome: Secondary | ICD-10-CM

## 2013-04-23 ENCOUNTER — Other Ambulatory Visit: Payer: Self-pay | Admitting: *Deleted

## 2013-04-23 DIAGNOSIS — I4821 Permanent atrial fibrillation: Secondary | ICD-10-CM

## 2013-04-23 DIAGNOSIS — Z79899 Other long term (current) drug therapy: Secondary | ICD-10-CM

## 2013-04-23 DIAGNOSIS — I10 Essential (primary) hypertension: Secondary | ICD-10-CM

## 2013-04-23 DIAGNOSIS — E785 Hyperlipidemia, unspecified: Secondary | ICD-10-CM

## 2013-04-23 NOTE — Progress Notes (Signed)
LAB ORDERS MAILED TO HOME ADDRESS. 

## 2013-06-19 ENCOUNTER — Ambulatory Visit (INDEPENDENT_AMBULATORY_CARE_PROVIDER_SITE_OTHER): Payer: Medicare Other | Admitting: Internal Medicine

## 2013-06-19 ENCOUNTER — Encounter: Payer: Self-pay | Admitting: Internal Medicine

## 2013-06-19 VITALS — BP 118/78 | HR 70 | Ht 64.0 in | Wt 217.0 lb

## 2013-06-19 DIAGNOSIS — Z950 Presence of cardiac pacemaker: Secondary | ICD-10-CM

## 2013-06-19 DIAGNOSIS — Z9889 Other specified postprocedural states: Secondary | ICD-10-CM

## 2013-06-19 DIAGNOSIS — I4891 Unspecified atrial fibrillation: Secondary | ICD-10-CM

## 2013-06-19 DIAGNOSIS — I442 Atrioventricular block, complete: Secondary | ICD-10-CM

## 2013-06-19 DIAGNOSIS — I4821 Permanent atrial fibrillation: Secondary | ICD-10-CM

## 2013-06-19 LAB — MDC_IDC_ENUM_SESS_TYPE_INCLINIC
Battery Impedance: 2000 Ohm
Battery Voltage: 2.72 V
Brady Statistic RV Percent Paced: 99 %
Date Time Interrogation Session: 20141211092430
Implantable Pulse Generator Model: 5816
Implantable Pulse Generator Serial Number: 1754260
Lead Channel Pacing Threshold Amplitude: 0.875 V
Lead Channel Setting Sensing Sensitivity: 4 mV

## 2013-06-19 NOTE — Progress Notes (Signed)
PCP:  Kirstie Peri, MD  The patient presents today for routine electrophysiology followup.  Since last being seen in our clinic, the patient reports doing very well.  Today, she denies symptoms of palpitations, chest pain, shortness of breath, orthopnea, PND, lower extremity edema, dizziness, presyncope, syncope, or neurologic sequela.  The patient feels that she is tolerating medications without difficulties and is otherwise without complaint today.   Past Medical History  Diagnosis Date  . Type 2 diabetes mellitus   . Essential hypertension, benign   . Breast cancer     Bilateral s/p mastectomies  . Permanent atrial fibrillation     Status post atrioventricular nodal ablation/St. Jude dual  chamber pacemaker implantation., chronic coumadin therapy followed by Dr. Sherryll Burger  . Nonischemic cardiomyopathy     Question tachycardia mediated  . Hypothyroidism   . Renal insufficiency   . OSA (obstructive sleep apnea)    Past Surgical History  Procedure Laterality Date  . Pacemaker placement      St. Jude - follows with Dr. Johney Frame    Current Outpatient Prescriptions  Medication Sig Dispense Refill  . ALPRAZolam (XANAX) 0.5 MG tablet Take 0.5 mg by mouth as needed.        [name] atorvastatin (LIPITOR) 20 MG tablet Take 20 mg by mouth daily.        . cetirizine (ZYRTEC) 10 MG tablet Take 10 mg by mouth daily.        . cyclobenzaprine (FLEXERIL) 10 MG tablet Take 10 mg by mouth 3 (three) times daily as needed.       . furosemide (LASIX) 40 MG tablet Take 40 mg by mouth daily as needed.       [name] glimepiride (AMARYL) 4 MG tablet Take 4 mg by mouth 2 (two) times daily.        [name] KLOR-CON 10 10 MEQ tablet Take 1 tablet by mouth daily as needed.      [name] levothyroxine (SYNTHROID, LEVOTHROID) 75 MCG tablet Take 75 mcg by mouth daily.        . metFORMIN (GLUCOPHAGE) 500 MG tablet Take 1 tablet by mouth Twice daily.      . valsartan-hydrochlorothiazide (DIOVAN-HCT) 160-12.5 MG per tablet Take 1 capsule by mouth  Daily.      [name] warfarin (COUMADIN) 5 MG tablet Take 5 mg by mouth daily. Managed by Dr. Sherryll Burger        No current facility-administered medications for this visit.    Allergies  Allergen Reactions  . Codeine   . Nitrofurantoin     History   Social History  . Marital Status: Widowed    Spouse Name: N/A    Number of Children: N/A  . Years of Education: N/A   Occupational History  . Not on file.   Social History Main Topics  . Smoking status: Never Smoker   . Smokeless tobacco: Never Used  . Alcohol Use: No  . Drug Use: No  . Sexual Activity: Not on file   Other Topics Concern  . Not on file   Social History Narrative  . No narrative on file    Physical Exam: Filed Vitals:   06/19/13 0903  BP: 118/78  Pulse: 70  Height: 5\' 4"  (1.626 m)  Weight: 217 lb (98.431 kg)    GEN- The patient is well appearing, alert and oriented x 3 today.   Head- normocephalic, atraumatic Eyes-  Sclera clear, conjunctiva pink Ears- hearing intact Oropharynx- clear Neck- supple, no JVP Lymph- no cervical lymphadenopathy  Lungs- Clear to ausculation bilaterally, normal work of breathing Chest- pacemaker pocket is well healed Heart- Regular rate and rhythm, no murmurs, rubs or gallops, PMI not laterally displaced GI- soft, NT, ND, + BS Extremities- no clubbing, cyanosis, or edema  Pacemaker interrogation- reviewed in detail today,  See PACEART report  Assessment and Plan:  1. Permanent afib Rate controlled Stop ASA Continue coumadin long term Today, I discussed novel anticoagulants including pradaxa, xarelto, and eliquis today as indicated for risk reduction in stroke and systemic emboli with nonvalvular atrial fibrillation.  Risks, benefits, and alternatives to each of these drugs were discussed at length today.  She is clear that she would prefer to continue coumadin with close INR followups by Dr Sherryll Burger at this time.  2. Complete heart block Normal pacemaker function See Pace Art  report No changes today  Return to see me in 1year

## 2013-06-19 NOTE — Patient Instructions (Signed)
Your physician recommends that you schedule a follow-up appointment in: 6 months with [facility]. You should receive a letter in the mail in 4 months. If you do not receive this letter by April 2015 call our office to schedule this appointment.   Your physician recommends that you schedule a follow-up appointment in: 1 year with Dr. Johney Frame. You should receive a letter in the mail in 10 months. If you do not receive this letter by October 2015 call our office to schedule this appointment.   Your physician has recommended you make the following change in your medication:  Stop Aspirin  Continue all other medications the same.

## 2013-06-23 ENCOUNTER — Encounter: Payer: Self-pay | Admitting: Cardiology

## 2013-06-23 ENCOUNTER — Ambulatory Visit (INDEPENDENT_AMBULATORY_CARE_PROVIDER_SITE_OTHER): Payer: Medicare Other | Admitting: Cardiology

## 2013-06-23 VITALS — BP 121/81 | HR 78 | Ht 64.0 in | Wt 216.8 lb

## 2013-06-23 DIAGNOSIS — Z950 Presence of cardiac pacemaker: Secondary | ICD-10-CM

## 2013-06-23 DIAGNOSIS — I4891 Unspecified atrial fibrillation: Secondary | ICD-10-CM

## 2013-06-23 DIAGNOSIS — I10 Essential (primary) hypertension: Secondary | ICD-10-CM

## 2013-06-23 NOTE — Patient Instructions (Signed)

## 2013-06-23 NOTE — Assessment & Plan Note (Signed)
Symptomatically stable, underlies ventricular pacing with complete heart block. She continues on Coumadin, followed by Dr. Sherryll Burger.

## 2013-06-23 NOTE — Progress Notes (Signed)
Clinical Summary [name] [name] is an 79 y.o.female last seen in June. Recent interval visit with Dr. Johney Frame noted.  Recent lab work showed hemoglobin 16.2, platelets 230, BUN 22, creatinine 1.2, sodium 144, potassium 4.6, cholesterol 164, triglycerides 171, HDL 43, LDL 87, normal LFTs.  Echocardiogram from 3/12 showed LVEF 50-55% with inferolateral hypokinesis and septal motion consistent with pacing, MAC with no significant mitral regurgitation, RVSP 25-30 mm mercury, mildly dilated ascending aorta.  ECG today shows ventricular paced rhythm. She reports no problems with her current medications, no bleeding on Coumadin which is followed by Dr. Sherryll Burger.   Allergies  Allergen Reactions  . Codeine   . Nitrofurantoin     Current Outpatient Prescriptions  Medication Sig Dispense Refill  . ALPRAZolam (XANAX) 0.5 MG tablet Take 0.5 mg by mouth as needed.        [name] atorvastatin (LIPITOR) 20 MG tablet Take 20 mg by mouth daily.        . cetirizine (ZYRTEC) 10 MG tablet Take 10 mg by mouth daily.        . cyclobenzaprine (FLEXERIL) 10 MG tablet Take 10 mg by mouth 3 (three) times daily as needed.       . furosemide (LASIX) 40 MG tablet Take 40 mg by mouth daily as needed.       [name] glimepiride (AMARYL) 4 MG tablet Take 4 mg by mouth 2 (two) times daily.        [name] KLOR-CON 10 10 MEQ tablet Take 1 tablet by mouth daily as needed.      [name] levothyroxine (SYNTHROID, LEVOTHROID) 75 MCG tablet Take 75 mcg by mouth daily.        . metFORMIN (GLUCOPHAGE) 500 MG tablet Take 1 tablet by mouth Twice daily.      . valsartan-hydrochlorothiazide (DIOVAN-HCT) 160-12.5 MG per tablet Take 1 capsule by mouth Daily.      [name] warfarin (COUMADIN) 5 MG tablet Take 5 mg by mouth daily. Managed by Dr. Sherryll Burger        No current facility-administered medications for this visit.    Past Medical History  Diagnosis Date  . Type 2 diabetes mellitus   . Essential hypertension, benign   . Breast cancer     Bilateral s/p  mastectomies  . Permanent atrial fibrillation     Status post atrioventricular nodal ablation/St. Jude dual  chamber pacemaker implantation., chronic coumadin therapy followed by Dr. Sherryll Burger  . Nonischemic cardiomyopathy     Question tachycardia mediated  . Hypothyroidism   . Renal insufficiency   . OSA (obstructive sleep apnea)     Past Surgical History  Procedure Laterality Date  . Pacemaker placement      St. Jude - follows with Dr. Johney Frame    Social History [name] [name] reports that she has never smoked. She has never used smokeless tobacco. [name] [name] reports that she does not drink alcohol.  Review of Systems Mild chest congestion the mornings, not getting worse, no shortness of breath or fevers. Otherwise negative.  Physical Examination Filed Vitals:   06/23/13 1020  BP: 121/81  Pulse: 78   Filed Weights   06/23/13 1020  Weight: 216 lb 12.8 oz (98.34 kg)    Comfortable at rest.  HEENT: Conjunctiva and lids normal, oropharynx clear.  Neck: Supple, no elevated JVP or carotid bruits, no thyromegaly.  Lungs: Clear to auscultation, nonlabored breathing at rest.  Cardiac: Regular rate and rhythm, soft systolic murmur, no pericardial rub.  Abdomen: Soft, nontender,  bowel sounds present.  Extremities: No pitting edema, distal pulses 2+.  Skin: Warm and dry.  Musculoskeletal: No kyphosis.  Neuropsychiatric: Alert and oriented x3, talkative.   Problem List and Plan   Permanent atrial fibrillation Symptomatically stable, underlies ventricular pacing with complete heart block. She continues on Coumadin, followed by Dr. Sherryll Burger.  Pacemaker-St.Jude Keep followup with Dr. Johney Frame.  HYPERTENSION Blood pressure is normal today.    Jonelle Sidle, M.D., F.A.C.C.

## 2013-06-23 NOTE — Assessment & Plan Note (Signed)
Keep followup with Dr. Allred. 

## 2013-06-23 NOTE — Assessment & Plan Note (Signed)
Blood pressure is normal today. 

## 2013-07-02 ENCOUNTER — Encounter: Payer: Self-pay | Admitting: Internal Medicine

## 2013-08-19 DIAGNOSIS — I495 Sick sinus syndrome: Secondary | ICD-10-CM

## 2013-11-28 ENCOUNTER — Encounter: Payer: Self-pay | Admitting: Internal Medicine

## 2013-12-23 ENCOUNTER — Ambulatory Visit (INDEPENDENT_AMBULATORY_CARE_PROVIDER_SITE_OTHER): Payer: Medicare Other | Admitting: Cardiology

## 2013-12-23 ENCOUNTER — Encounter: Payer: Self-pay | Admitting: Cardiology

## 2013-12-23 VITALS — BP 127/76 | HR 71 | Ht 64.0 in | Wt 209.1 lb

## 2013-12-23 DIAGNOSIS — I10 Essential (primary) hypertension: Secondary | ICD-10-CM

## 2013-12-23 DIAGNOSIS — Z9889 Other specified postprocedural states: Secondary | ICD-10-CM

## 2013-12-23 DIAGNOSIS — I4821 Permanent atrial fibrillation: Secondary | ICD-10-CM

## 2013-12-23 DIAGNOSIS — I4891 Unspecified atrial fibrillation: Secondary | ICD-10-CM

## 2013-12-23 NOTE — Assessment & Plan Note (Signed)
Blood pressure control is good today. No changes made. 

## 2013-12-23 NOTE — Patient Instructions (Signed)

## 2013-12-23 NOTE — Assessment & Plan Note (Signed)
Symptomatically stable, continue Coumadin - followed by Dr. Manuella Ghazi.

## 2013-12-23 NOTE — Assessment & Plan Note (Signed)
St. Jude pacemaker in place, follows with Dr. Rayann Heman.

## 2013-12-23 NOTE — Progress Notes (Signed)
Clinical Summary Ms. Commins is an 80 y.o.female last seen in December 2014. She reports no palpitations or chest pain. She is limited by orthopedic issues, uses a walker. She denies any bleeding problems or falls.  Echocardiogram from 3/12 showed LVEF 50-55% with inferolateral hypokinesis and septal motion consistent with pacing, MAC with no significant mitral regurgitation, RVSP 25-30 mm mercury, mildly dilated ascending aorta.  She follows with Dr. Rayann Heman in the device clinic.   Allergies  Allergen Reactions  . Codeine   . Nitrofurantoin     Current Outpatient Prescriptions  Medication Sig Dispense Refill  . ALPRAZolam (XANAX) 0.5 MG tablet Take 0.5 mg by mouth as needed.        . calcium carbonate (OS-CAL) 600 MG TABS tablet Take 600 mg by mouth 2 (two) times daily.      . cetirizine (ZYRTEC) 10 MG tablet Take 10 mg by mouth daily.        . cholecalciferol (VITAMIN D) 1000 UNITS tablet Take 1,000 Units by mouth daily.      . cyclobenzaprine (FLEXERIL) 10 MG tablet Take 10 mg by mouth 3 (three) times daily as needed.       . furosemide (LASIX) 40 MG tablet Take 40 mg by mouth daily as needed.       [name] glimepiride (AMARYL) 4 MG tablet Take 4 mg by mouth 2 (two) times daily.        [name] KLOR-CON 10 10 MEQ tablet Take 1 tablet by mouth daily as needed.      [name] levothyroxine (SYNTHROID, LEVOTHROID) 75 MCG tablet Take 75 mcg by mouth daily.        . metFORMIN (GLUCOPHAGE) 500 MG tablet Take 1 tablet by mouth Twice daily.      . valsartan-hydrochlorothiazide (DIOVAN-HCT) 160-12.5 MG per tablet Take 1 capsule by mouth Daily.      [name] warfarin (COUMADIN) 5 MG tablet Take 5 mg by mouth daily. Managed by Dr. Manuella Ghazi        No current facility-administered medications for this visit.    Past Medical History  Diagnosis Date  . Type 2 diabetes mellitus   . Essential hypertension, benign   . Breast cancer     Bilateral s/p mastectomies  . Permanent atrial fibrillation     Status post  atrioventricular nodal ablation/St. Jude dual  chamber pacemaker implantation., chronic coumadin therapy followed by Dr. Manuella Ghazi  . Nonischemic cardiomyopathy     Question tachycardia mediated  . Hypothyroidism   . Renal insufficiency   . OSA (obstructive sleep apnea)     Past Surgical History  Procedure Laterality Date  . Pacemaker placement      St. Jude - follows with Dr. Rayann Heman    Social History Ms. Stanaland reports that she has never smoked. She has never used smokeless tobacco. Ms. Esguerra reports that she does not drink alcohol.  Review of Systems Stable appetite. Had a flare of gout related to protein intake. Other systems reviewed and negative except as outlined.  Physical Examination Filed Vitals:   12/23/13 1151  BP: 127/76  Pulse: 71   Filed Weights   12/23/13 1151  Weight: 209 lb 1.9 oz (94.856 kg)    Comfortable at rest.  HEENT: Conjunctiva and lids normal, oropharynx clear.  Neck: Supple, no elevated JVP or carotid bruits, no thyromegaly.  Lungs: Clear to auscultation, nonlabored breathing at rest.  Cardiac: Regular rate and rhythm, soft systolic murmur, no pericardial rub.  Abdomen: Soft, nontender, bowel  sounds present.  Extremities: No pitting edema, distal pulses 2+.  Skin: Warm and dry.  Musculoskeletal: No kyphosis.  Neuropsychiatric: Alert and oriented x3, talkative.   Problem List and Plan   Permanent atrial fibrillation Symptomatically stable, continue Coumadin - followed by Dr. Manuella Ghazi.  S/P AV nodal ablation St. Jude pacemaker in place, follows with Dr. Rayann Heman.  HYPERTENSION Blood pressure control is good today. No changes made.    Satira Sark, M.D., F.A.C.C.

## 2014-03-03 DIAGNOSIS — I495 Sick sinus syndrome: Secondary | ICD-10-CM

## 2014-03-17 ENCOUNTER — Encounter: Payer: Self-pay | Admitting: Internal Medicine

## 2014-04-12 ENCOUNTER — Encounter: Payer: Self-pay | Admitting: Cardiovascular Disease

## 2014-06-08 ENCOUNTER — Encounter: Payer: Self-pay | Admitting: Internal Medicine

## 2014-06-08 DIAGNOSIS — I495 Sick sinus syndrome: Secondary | ICD-10-CM

## 2014-06-16 ENCOUNTER — Ambulatory Visit (INDEPENDENT_AMBULATORY_CARE_PROVIDER_SITE_OTHER): Payer: Medicare Other | Admitting: Cardiology

## 2014-06-16 ENCOUNTER — Encounter: Payer: Self-pay | Admitting: Cardiology

## 2014-06-16 VITALS — BP 118/80 | HR 78 | Ht 64.0 in | Wt 214.0 lb

## 2014-06-16 DIAGNOSIS — I4821 Permanent atrial fibrillation: Secondary | ICD-10-CM

## 2014-06-16 DIAGNOSIS — I482 Chronic atrial fibrillation: Secondary | ICD-10-CM

## 2014-06-16 DIAGNOSIS — I10 Essential (primary) hypertension: Secondary | ICD-10-CM

## 2014-06-16 NOTE — Assessment & Plan Note (Signed)
Blood pressure is well controlled today. No changes were made.

## 2014-06-16 NOTE — Patient Instructions (Signed)
Your physician wants you to follow-up in: 6 months with Dr. McDowell You will receive a reminder letter in the mail two months in advance. If you don't receive a letter, please call our office to schedule the follow-up appointment.  Your physician recommends that you continue on your current medications as directed. Please refer to the Current Medication list given to you today.  Thank you for choosing [facility]!!    

## 2014-06-16 NOTE — Progress Notes (Signed)
Reason for visit: Atrial fibrillation, hypertension  Clinical Summary Ms. Goodspeed is an 80 y.o.female last seen in June. She presents for a routine visit, reports no major change in overall status. Still using compression hose to manage leg edema. Ambulates with a cane, she reports chronic unsteadiness but no recent falls.  She continues to follow Coumadin with Dr. Manuella Ghazi. States that she had lab work obtained a few Melin ago, results being requested for review. She reports no major bleeding episodes. She denies any major feeling of palpitations. ECG today shows a ventricular paced rhythm.  Echocardiogram from 3/12 showed LVEF 50-55% with inferolateral hypokinesis and septal motion consistent with pacing, MAC with no significant mitral regurgitation, RVSP 25-30 mm mercury, mildly dilated ascending aorta.   Allergies  Allergen Reactions  . Codeine   . Nitrofurantoin     Current Outpatient Prescriptions  Medication Sig Dispense Refill  . ALPRAZolam (XANAX) 0.5 MG tablet Take 0.5 mg by mouth as needed.      . calcium carbonate (OS-CAL) 600 MG TABS tablet Take 600 mg by mouth 2 (two) times daily.    . cetirizine (ZYRTEC) 10 MG tablet Take 10 mg by mouth daily.      . cholecalciferol (VITAMIN D) 1000 UNITS tablet Take 1,000 Units by mouth daily. 2,000 units per day    . glimepiride (AMARYL) 4 MG tablet Take 4 mg by mouth 2 (two) times daily.      [name] KLOR-CON 10 10 MEQ tablet Take 1 tablet by mouth daily as needed.    [name] levothyroxine (SYNTHROID, LEVOTHROID) 75 MCG tablet Take 75 mcg by mouth daily.      . metFORMIN (GLUCOPHAGE) 500 MG tablet Take 1 tablet by mouth Twice daily.    . valsartan-hydrochlorothiazide (DIOVAN-HCT) 160-12.5 MG per tablet Take 1 capsule by mouth Daily.    [name] warfarin (COUMADIN) 5 MG tablet Take 5 mg by mouth daily. Managed by Dr. Manuella Ghazi 2.5 mg 3 times weekly     No current facility-administered medications for this visit.    Past Medical History  Diagnosis Date  .  Type 2 diabetes mellitus   . Essential hypertension, benign   . Breast cancer     Bilateral s/p mastectomies  . Permanent atrial fibrillation     Status post atrioventricular nodal ablation/St. Jude dual  chamber pacemaker implantation., chronic coumadin therapy followed by Dr. Manuella Ghazi  . Nonischemic cardiomyopathy     Question tachycardia mediated  . Hypothyroidism   . Renal insufficiency   . OSA (obstructive sleep apnea)     Past Surgical History  Procedure Laterality Date  . Pacemaker placement      St. Jude - follows with Dr. Rayann Heman    Social History Ms. Haigler reports that she has never smoked. She has never used smokeless tobacco. Ms. Gieselman reports that she does not drink alcohol.  Review of Systems Complete review of systems negative except as otherwise outlined in the clinical summary.  Physical Examination Filed Vitals:   06/16/14 1324  BP: 118/80  Pulse: 78   Filed Weights   06/16/14 1324  Weight: 214 lb (97.07 kg)    Appears comfortable at rest.  HEENT: Conjunctiva and lids normal, oropharynx clear.  Neck: Supple, no elevated JVP or carotid bruits, no thyromegaly.  Lungs: Clear to auscultation, nonlabored breathing at rest.  Cardiac: Regular rate and rhythm, soft systolic murmur, no pericardial rub.  Abdomen: Soft, nontender, bowel sounds present.  Extremities: Chronic appearing edema with compression hose in place,  distal pulses 2+.    Problem List and Plan   Permanent atrial fibrillation Symptomatically stable status post AV node ablation with St. Jude pacemaker. She continues on Coumadin which is followed by Dr. Manuella Ghazi. ECG reviewed today.  Essential hypertension Blood pressure is well controlled today. No changes were made.    Satira Sark, M.D., F.A.C.C.

## 2014-06-16 NOTE — Assessment & Plan Note (Signed)
Symptomatically stable status post AV node ablation with St. Jude pacemaker. She continues on Coumadin which is followed by Dr. Manuella Ghazi. ECG reviewed today.

## 2014-06-16 NOTE — Addendum Note (Signed)
Addended by: Julian Hy T on: 06/16/2014 02:09 PM   Modules accepted: Orders

## 2014-06-19 ENCOUNTER — Ambulatory Visit (INDEPENDENT_AMBULATORY_CARE_PROVIDER_SITE_OTHER): Payer: Medicare Other | Admitting: Internal Medicine

## 2014-06-19 VITALS — BP 112/74 | HR 67 | Ht 64.0 in | Wt 214.8 lb

## 2014-06-19 DIAGNOSIS — I442 Atrioventricular block, complete: Secondary | ICD-10-CM

## 2014-06-19 DIAGNOSIS — I482 Chronic atrial fibrillation: Secondary | ICD-10-CM

## 2014-06-19 DIAGNOSIS — I10 Essential (primary) hypertension: Secondary | ICD-10-CM

## 2014-06-19 DIAGNOSIS — I4821 Permanent atrial fibrillation: Secondary | ICD-10-CM

## 2014-06-19 LAB — MDC_IDC_ENUM_SESS_TYPE_INCLINIC
Battery Impedance: 2900 Ohm
Battery Voltage: 2.69 V
Brady Statistic RV Percent Paced: 99 %
Date Time Interrogation Session: 20151211112915
Implantable Pulse Generator Model: 5816
Implantable Pulse Generator Serial Number: 1754260
Lead Channel Pacing Threshold Amplitude: 0.5 V
Lead Channel Setting Pacing Pulse Width: 1.5 ms
Lead Channel Setting Sensing Sensitivity: 4 mV
MDC IDC MSMT LEADCHNL RV IMPEDANCE VALUE: 453 Ohm
MDC IDC MSMT LEADCHNL RV PACING THRESHOLD PULSEWIDTH: 1.5 ms

## 2014-06-19 NOTE — Progress Notes (Signed)
PCP:  Monico Blitz, MD  The patient presents today for routine electrophysiology followup.  Since last being seen in our clinic, the patient reports doing very well.  Today, she denies symptoms of palpitations, chest pain, shortness of breath, orthopnea, PND, lower extremity edema, dizziness, presyncope, syncope, or neurologic sequela.  The patient feels that she is tolerating medications without difficulties and is otherwise without complaint today.   Past Medical History  Diagnosis Date  . Type 2 diabetes mellitus   . Essential hypertension, benign   . Breast cancer     Bilateral s/p mastectomies  . Permanent atrial fibrillation     Status post atrioventricular nodal ablation/St. Jude dual  chamber pacemaker implantation., chronic coumadin therapy followed by Dr. Manuella Ghazi  . Nonischemic cardiomyopathy     Question tachycardia mediated  . Hypothyroidism   . Renal insufficiency   . OSA (obstructive sleep apnea)    Past Surgical History  Procedure Laterality Date  . Pacemaker placement      St. Jude - follows with Dr. Rayann Heman    Current Outpatient Prescriptions  Medication Sig Dispense Refill  . ALPRAZolam (XANAX) 0.5 MG tablet Take 0.5 mg by mouth as needed.      . calcium carbonate (OS-CAL) 600 MG TABS tablet Take 600 mg by mouth 2 (two) times daily.    . cetirizine (ZYRTEC) 10 MG tablet Take 10 mg by mouth daily.      . cholecalciferol (VITAMIN D) 1000 UNITS tablet Take 1,000 Units by mouth daily. 2,000 units per day    . glimepiride (AMARYL) 4 MG tablet Take 4 mg by mouth 2 (two) times daily.      [name] KLOR-CON 10 10 MEQ tablet Take 1 tablet by mouth daily as needed.    [name] levothyroxine (SYNTHROID, LEVOTHROID) 75 MCG tablet Take 75 mcg by mouth daily.      . metFORMIN (GLUCOPHAGE) 500 MG tablet Take 1 tablet by mouth Twice daily.    . valsartan-hydrochlorothiazide (DIOVAN-HCT) 160-12.5 MG per tablet Take 1 capsule by mouth Daily.    [name] warfarin (COUMADIN) 5 MG tablet Take 5 mg by mouth  daily. Managed by Dr. Manuella Ghazi 2.5 mg 3 times weekly     No current facility-administered medications for this visit.    Allergies  Allergen Reactions  . Codeine   . Nitrofurantoin     History   Social History  . Marital Status: Widowed    Spouse Name: N/A    Number of Children: N/A  . Years of Education: N/A   Occupational History  . Not on file.   Social History Main Topics  . Smoking status: Never Smoker   . Smokeless tobacco: Never Used  . Alcohol Use: No  . Drug Use: No  . Sexual Activity: Not on file   Other Topics Concern  . Not on file   Social History Narrative    Physical Exam: Filed Vitals:   06/19/14 1114  BP: 112/74  Pulse: 67  Height: 5\' 4"  (1.626 m)  Weight: 214 lb 12.8 oz (97.433 kg)  SpO2: 96%    GEN- The patient is well appearing, alert and oriented x 3 today.   Head- normocephalic, atraumatic Eyes-  Sclera clear, conjunctiva pink Ears- hearing intact Oropharynx- clear Neck- supple,  Lungs- Clear to ausculation bilaterally, normal work of breathing Chest- pacemaker pocket is well healed Heart- Regular rate and rhythm, no murmurs, rubs or gallops, PMI not laterally displaced GI- soft, NT, ND, + BS Extremities- no clubbing, cyanosis, or  edema  Pacemaker interrogation- reviewed in detail today,  See PACEART report  Assessment and Plan:  1. Permanent afib Rate controlled Continue coumadin long term  2. Complete heart block Normal pacemaker function See Pace Art report No changes today  3. HTN Stable No change required today  TTMs every 3 months Return to see me in 1year

## 2014-06-19 NOTE — Patient Instructions (Signed)
Your physician recommends that you schedule a follow-up appointment in: 1 year with Dr. Rayann Heman. You will receive a reminder letter in the mail in about 10 months reminding you to call and schedule your appointment. If you don't receive this letter, please contact our office. [employer] every 3 months. Your physician recommends that you continue on your current medications as directed. Please refer to the Current Medication list given to you today.

## 2014-07-07 ENCOUNTER — Encounter: Payer: Self-pay | Admitting: Internal Medicine

## 2014-08-03 ENCOUNTER — Ambulatory Visit: Payer: Self-pay | Admitting: Cardiology

## 2014-08-04 ENCOUNTER — Other Ambulatory Visit: Payer: Self-pay | Admitting: *Deleted

## 2014-08-04 MED ORDER — VALSARTAN-HYDROCHLOROTHIAZIDE 160-12.5 MG PO TABS: 1.0000 | ORAL_TABLET | Freq: Every day | ORAL | Status: DC

## 2014-08-04 NOTE — Telephone Encounter (Signed)
Clarissa called and the cost is $16.25 for 30 days worth vs [facility] $109. Patient aware.

## 2014-09-07 ENCOUNTER — Encounter: Payer: Self-pay | Admitting: Internal Medicine

## 2014-09-07 DIAGNOSIS — I495 Sick sinus syndrome: Secondary | ICD-10-CM | POA: Diagnosis not present

## 2014-09-18 ENCOUNTER — Encounter: Payer: Self-pay | Admitting: *Deleted

## 2014-09-21 ENCOUNTER — Other Ambulatory Visit: Payer: Self-pay | Admitting: Internal Medicine

## 2014-10-12 ENCOUNTER — Ambulatory Visit (INDEPENDENT_AMBULATORY_CARE_PROVIDER_SITE_OTHER): Payer: Medicare Other | Admitting: Cardiology

## 2014-10-12 ENCOUNTER — Encounter: Payer: Self-pay | Admitting: Cardiology

## 2014-10-12 VITALS — BP 120/80 | HR 78 | Ht 64.0 in | Wt 213.0 lb

## 2014-10-12 DIAGNOSIS — Z9889 Other specified postprocedural states: Secondary | ICD-10-CM

## 2014-10-12 DIAGNOSIS — N289 Disorder of kidney and ureter, unspecified: Secondary | ICD-10-CM

## 2014-10-12 DIAGNOSIS — I10 Essential (primary) hypertension: Secondary | ICD-10-CM | POA: Diagnosis not present

## 2014-10-12 DIAGNOSIS — IMO0002 Reserved for concepts with insufficient information to code with codable children: Secondary | ICD-10-CM

## 2014-10-12 DIAGNOSIS — I482 Chronic atrial fibrillation: Secondary | ICD-10-CM | POA: Diagnosis not present

## 2014-10-12 DIAGNOSIS — N189 Chronic kidney disease, unspecified: Secondary | ICD-10-CM

## 2014-10-12 DIAGNOSIS — E1165 Type 2 diabetes mellitus with hyperglycemia: Secondary | ICD-10-CM

## 2014-10-12 DIAGNOSIS — N179 Acute kidney failure, unspecified: Secondary | ICD-10-CM

## 2014-10-12 DIAGNOSIS — I4821 Permanent atrial fibrillation: Secondary | ICD-10-CM

## 2014-10-12 NOTE — Progress Notes (Signed)
Cardiology Office Note  Date: 10/12/2014   ID: [name] [name], DOB Feb 04, 1929, MRN [id number]  PCP: Monico Blitz, MD  Primary Cardiologist: Rozann Lesches, MD   Chief Complaint  Patient presents with  . Cardiomyopathy  . Atrial Fibrillation    History of Present Illness: [name] [name] is an 81 y.o. female last seen in December 2015. She has had interval follow-up with Dr. Rayann Heman as well for device management. Record review finds hospitalization at [facility] back in February. Available information indicates that she went in at that time complaining of foot pain and leg weakness, difficulty ambulating. Discharge summary indicates planter fasciitis or possibly gouty arthritis. She was treated with steroids with improvement in symptoms. I also note that she had acute on chronic renal insufficiency, creatinine was initially 2.0, came down to 1.3 with fluids. This was in the setting of uncontrolled diabetes with hyperglycemia and blood sugars in the 200-300 range. She has subsequently been taken off of metformin and also valsartan hydrochlorothiazide. Blood pressures reportedly low normal at home,measurement is normal today. Recent lab work per Dr. Manuella Ghazi is noted below.  From a cardiac perspective she remains on Coumadin with recent therapeutic INR, continues with pacemaker interrogation per Dr. Rayann Heman. Reports no definite increasing breathlessness with activity.   Past Medical History  Diagnosis Date  . Type 2 diabetes mellitus   . Essential hypertension, benign   . Breast cancer     Bilateral s/p mastectomies  . Permanent atrial fibrillation     Status post atrioventricular nodal ablation/St. Jude dual  chamber pacemaker implantation., chronic coumadin therapy followed by Dr. Manuella Ghazi  . Nonischemic cardiomyopathy     Question tachycardia mediated  . Hypothyroidism   . Renal insufficiency   . OSA (obstructive sleep apnea)     Past Surgical History  Procedure Laterality Date  .  Pacemaker placement      St. Jude - follows with Dr. Rayann Heman    Current Outpatient Prescriptions  Medication Sig Dispense Refill  . ALPRAZolam (XANAX) 0.5 MG tablet Take 0.5 mg by mouth as needed.      . calcium carbonate (OS-CAL) 600 MG TABS tablet Take 600 mg by mouth 2 (two) times daily.    . cetirizine (ZYRTEC) 10 MG tablet Take 10 mg by mouth daily.      . cholecalciferol (VITAMIN D) 1000 UNITS tablet Take 1,000 Units by mouth daily. 2,000 units per day    . glimepiride (AMARYL) 4 MG tablet Take 4 mg by mouth 2 (two) times daily.      [name] KLOR-CON 10 10 MEQ tablet Take 1 tablet by mouth daily as needed.    [name] levothyroxine (SYNTHROID, LEVOTHROID) 75 MCG tablet Take 75 mcg by mouth daily.      [name] warfarin (COUMADIN) 5 MG tablet Take 5 mg by mouth daily. Managed by Dr. Manuella Ghazi 2.5 mg 3 times weekly     No current facility-administered medications for this visit.    Allergies:  Codeine and Nitrofurantoin   Social History: The patient  reports that she has never smoked. She has never used smokeless tobacco. She reports that she does not drink alcohol or use illicit drugs.   ROS:  Please see the history of present illness. Otherwise, complete review of systems is positive for none.  All other systems are reviewed and negative.   Physical Exam: VS:  BP 120/80 mmHg  Pulse 78  Ht 5\' 4"  (1.626 m)  Wt 213 lb (96.616 kg)  BMI 36.54 kg/m2  SpO2 97%, BMI Body mass index is 36.54 kg/(m^2).  Wt Readings from Last 3 Encounters:  10/12/14 213 lb (96.616 kg)  06/19/14 214 lb 12.8 oz (97.433 kg)  06/16/14 214 lb (97.07 kg)     Appears comfortable at rest.  HEENT: Conjunctiva and lids normal, oropharynx clear.  Neck: Supple, no elevated JVP or carotid bruits, no thyromegaly.  Lungs: Clear to auscultation, nonlabored breathing at rest.  Cardiac: Regular rate and rhythm, soft systolic murmur, no pericardial rub.  Abdomen: Soft, nontender, bowel sounds present.  Extremities: Chronic  appearing edema with compression hose in place, distal pulses 2+.    ECG: ECG is not ordered today.   Recent Labwork:  10/05/2014 TSH 4.6, uric acid 7.7, hemoglobin 16.1, platelets 255, INR 2.2, BUN 31, creatinine 1.6, sodium 139, potassium 3.9, glucose 347, AST 22, ALT 13.  Assessment and Plan:  1. Permanent atrial fibrillation status post AV node ablation, seen Jude pacemaker in place, on chronic Coumadin with recent therapeutic INR. Device is followed by Dr. Rayann Heman.  2. History of nonischemic cardiomyopathy with subsequent improvement in LVEF to low normal range and hypertension. Recently taken off valsartan hydrochlorothiazide in the setting of acute on chronic renal insufficiency and relatively low blood pressures. I agree with this change. I asked her to keep an eye on blood pressure at home and report back if trend begins to increase.  3. Type 2 diabetes mellitus with poorly controlled blood sugars in the 200-300 range. She is on Amaryl now, taken off metformin. I asked her to keep check on blood sugar at home and report back to Dr. Manuella Ghazi soon and she may need further medication adjustments.    Current medicines were reviewed with the patient today.  Disposition: FU with me in 2 months.   Signed, Satira Sark, MD, [facility] 10/12/2014 3:37 PM    [facility] at [place], [place], [place] [address] Phone: [phone]; Fax: [phone]

## 2014-10-12 NOTE — Patient Instructions (Signed)
Continue all current medications. Continue to monitor blood sugars & report to Dr. Manuella Ghazi. Follow up in  2 months

## 2014-12-08 DIAGNOSIS — I495 Sick sinus syndrome: Secondary | ICD-10-CM | POA: Diagnosis not present

## 2014-12-22 ENCOUNTER — Encounter: Payer: Self-pay | Admitting: Cardiology

## 2014-12-22 ENCOUNTER — Ambulatory Visit (INDEPENDENT_AMBULATORY_CARE_PROVIDER_SITE_OTHER): Payer: Medicare Other | Admitting: Cardiology

## 2014-12-22 VITALS — BP 118/88 | HR 79 | Ht 64.0 in | Wt 214.0 lb

## 2014-12-22 DIAGNOSIS — I4821 Permanent atrial fibrillation: Secondary | ICD-10-CM

## 2014-12-22 DIAGNOSIS — I482 Chronic atrial fibrillation: Secondary | ICD-10-CM

## 2014-12-22 DIAGNOSIS — Z9889 Other specified postprocedural states: Secondary | ICD-10-CM

## 2014-12-22 NOTE — Progress Notes (Signed)
Cardiology Office Note  Date: 12/22/2014   ID: MAAME DACK, DOB Nov 05, 1928, MRN [id number]  PCP: Monico Blitz, MD  Primary Cardiologist: Rozann Lesches, MD   Chief Complaint  Patient presents with  . Cardiomyopathy  . Atrial Fibrillation    History of Present Illness: [name] [name] is an 81 y.o. female last seen in April. She presents for a routine follow-up visit. She was just recently seen at her primary care provider office complaining of chest congestion and cough, started on antibiotics. She denies any fevers or chills. No chest pain or palpitations.  She continues with follow-up for her St. Jude pacemaker with Dr. Rayann Heman.  Weight has been stable, she has had no orthopnea or PND. She lives with her son now in his home. Has some difficulty getting around. She reports recent hospitalizations at [facility] related to a urinary tract infection and also gout flare.   Past Medical History  Diagnosis Date  . Type 2 diabetes mellitus   . Essential hypertension, benign   . Breast cancer     Bilateral s/p mastectomies  . Permanent atrial fibrillation     Status post atrioventricular nodal ablation/St. Jude dual  chamber pacemaker implantation., chronic coumadin therapy followed by Dr. Manuella Ghazi  . Nonischemic cardiomyopathy     Question tachycardia mediated  . Hypothyroidism   . Renal insufficiency   . OSA (obstructive sleep apnea)     Past Surgical History  Procedure Laterality Date  . Pacemaker placement      St. Jude - follows with Dr. Rayann Heman    Current Outpatient Prescriptions  Medication Sig Dispense Refill  . ALPRAZolam (XANAX) 0.5 MG tablet Take 0.5 mg by mouth as needed.      . calcium carbonate (OS-CAL) 600 MG TABS tablet Take 600 mg by mouth 2 (two) times daily.    . cetirizine (ZYRTEC) 10 MG tablet Take 10 mg by mouth daily.      . cholecalciferol (VITAMIN D) 1000 UNITS tablet Take 1,000 Units by mouth daily. 2,000 units per day    . glimepiride (AMARYL)  4 MG tablet Take 4 mg by mouth 2 (two) times daily.      [name] KLOR-CON 10 10 MEQ tablet Take 1 tablet by mouth daily as needed.    [name] levothyroxine (SYNTHROID, LEVOTHROID) 75 MCG tablet Take 75 mcg by mouth daily.      [name] warfarin (COUMADIN) 5 MG tablet Take 5 mg by mouth daily. Managed by Dr. Manuella Ghazi 2.5 mg 3 times weekly     No current facility-administered medications for this visit.    Allergies:  Codeine and Nitrofurantoin   Social History: The patient  reports that she has never smoked. She has never used smokeless tobacco. She reports that she does not drink alcohol or use illicit drugs.   ROS:  Please see the history of present illness. Otherwise, complete review of systems is positive for none.  All other systems are reviewed and negative.   Physical Exam: VS:  BP 118/88 mmHg  Pulse 79  Ht 5\' 4"  (1.626 m)  Wt 214 lb (97.07 kg)  BMI 36.72 kg/m2  SpO2 98%, BMI Body mass index is 36.72 kg/(m^2).  Wt Readings from Last 3 Encounters:  12/22/14 214 lb (97.07 kg)  10/12/14 213 lb (96.616 kg)  06/19/14 214 lb 12.8 oz (97.433 kg)     Appears comfortable at rest.  HEENT: Conjunctiva and lids normal, oropharynx clear.  Neck: Supple, no elevated JVP or carotid bruits, no  thyromegaly.  Lungs: Clear to auscultation, nonlabored breathing at rest.  Cardiac: Regular rate and rhythm, soft systolic murmur, no pericardial rub.  Abdomen: Soft, nontender, bowel sounds present.  Extremities: Chronic appearing edema with compression hose in place, distal pulses 2+.    ECG: ECG is not ordered today.   Recent Labwork:  10/05/2014: TSH 4.6, hemoglobin 16.1, platelets 255, INR 2.2, BUN 31, creatinine 1.6, potassium 3.9, AST 22, ALT 13  Assessment and Plan:  1. Chronic atrial fibrillation status post AV node ablation with St. Jude pacemaker. She continues on Coumadin. Device follow-up continues with Dr. Rayann Heman.  2. History of nonischemic cardiomyopathy with subsequent normalization of  LVEF.  Current medicines were reviewed with the patient today.  Disposition: FU with me in 6 months.   Signed, Satira Sark, MD, [facility] 12/22/2014 3:05 PM    [facility] at [place] [address], [place], [place] [address] Phone: [phone]; Fax: [phone]

## 2014-12-22 NOTE — Patient Instructions (Signed)
Your physician recommends that you continue on your current medications as directed. Please refer to the Current Medication list given to you today. Your physician recommends that you schedule a follow-up appointment in: 6 months. You will receive a reminder letter in the mail in about 4 months reminding you to call and schedule your appointment. If you don't receive this letter, please contact our office. 

## 2015-03-31 DIAGNOSIS — I482 Chronic atrial fibrillation: Secondary | ICD-10-CM | POA: Diagnosis not present

## 2015-06-21 ENCOUNTER — Ambulatory Visit: Payer: Medicare Other | Admitting: Cardiology

## 2015-07-16 ENCOUNTER — Ambulatory Visit (INDEPENDENT_AMBULATORY_CARE_PROVIDER_SITE_OTHER): Payer: Medicare Other | Admitting: Internal Medicine

## 2015-07-16 ENCOUNTER — Encounter: Payer: Self-pay | Admitting: Internal Medicine

## 2015-07-16 VITALS — BP 110/68 | HR 71 | Ht 64.0 in | Wt 220.0 lb

## 2015-07-16 DIAGNOSIS — I482 Chronic atrial fibrillation: Secondary | ICD-10-CM | POA: Diagnosis not present

## 2015-07-16 DIAGNOSIS — I10 Essential (primary) hypertension: Secondary | ICD-10-CM

## 2015-07-16 DIAGNOSIS — I442 Atrioventricular block, complete: Secondary | ICD-10-CM | POA: Diagnosis not present

## 2015-07-16 DIAGNOSIS — I4821 Permanent atrial fibrillation: Secondary | ICD-10-CM

## 2015-07-16 LAB — CUP PACEART INCLINIC DEVICE CHECK
Date Time Interrogation Session: 20170106131429
Implantable Lead Implant Date: 20070829
Implantable Lead Location: 753859
Implantable Lead Location: 753860
Lead Channel Pacing Threshold Amplitude: 1 V
Lead Channel Pacing Threshold Pulse Width: 0.5 ms
MDC IDC LEAD IMPLANT DT: 20070829
MDC IDC MSMT BATTERY IMPEDANCE: 12900 Ohm
MDC IDC MSMT BATTERY VOLTAGE: 2.64 V
MDC IDC MSMT LEADCHNL RV IMPEDANCE VALUE: 418 Ohm
MDC IDC PG SERIAL: 1754260
MDC IDC SET LEADCHNL RV PACING PULSEWIDTH: 1.5 ms
MDC IDC SET LEADCHNL RV SENSING SENSITIVITY: 4 mV

## 2015-07-16 NOTE — Patient Instructions (Signed)
Your physician recommends that you continue on your current medications as directed. Please refer to the Current Medication list given to you today. You will be seen in the device clinic in 3 months. Your physician recommends that you schedule a follow-up appointment in: 9 months with Dr. Rayann Heman. You can schedule this appointment today or you can wait for your letter to come in the mail in about 6 months reminding you to call and schedule this appointment. If you do not receive this letter, please contact our office for your appointment.

## 2015-07-16 NOTE — Progress Notes (Signed)
PCP:  Monico Blitz, MD  The patient presents today for routine electrophysiology followup.  Since last being seen in our clinic, the patient reports doing very well.  Her primary concern is with recently elevated blood sugar.  She has been managed by Dr Manuella Ghazi and they appear to be doing much better at this time.  Today, she denies symptoms of palpitations, chest pain, shortness of breath, orthopnea, PND, lower extremity edema, dizziness, presyncope, syncope, or neurologic sequela.  The patient feels that she is tolerating medications without difficulties and is otherwise without complaint today.   Past Medical History  Diagnosis Date  . Type 2 diabetes mellitus ([place])   . Essential hypertension, benign   . Breast cancer ([place])     Bilateral s/p mastectomies  . Permanent atrial fibrillation (HCC)     Status post atrioventricular nodal ablation/St. Jude dual  chamber pacemaker implantation., chronic coumadin therapy followed by Dr. Manuella Ghazi  . Nonischemic cardiomyopathy (HCC)     Question tachycardia mediated  . Hypothyroidism   . Renal insufficiency   . OSA (obstructive sleep apnea)    Past Surgical History  Procedure Laterality Date  . Pacemaker placement      St. Jude - follows with Dr. Rayann Heman    Current Outpatient Prescriptions  Medication Sig Dispense Refill  . ALPRAZolam (XANAX) 0.5 MG tablet Take 0.5 mg by mouth as needed.      . calcium carbonate (OS-CAL) 600 MG TABS tablet Take 600 mg by mouth 2 (two) times daily.    . cetirizine (ZYRTEC) 10 MG tablet Take 10 mg by mouth daily.      . cholecalciferol (VITAMIN D) 1000 UNITS tablet Take 1,000 Units by mouth daily. 2,000 units per day    . glimepiride (AMARYL) 4 MG tablet Take 4 mg by mouth 2 (two) times daily.      [name] KLOR-CON 10 10 MEQ tablet Take 1 tablet by mouth daily as needed.    [name] levothyroxine (SYNTHROID, LEVOTHROID) 75 MCG tablet Take 75 mcg by mouth daily.      . valsartan-hydrochlorothiazide (DIOVAN-HCT) 160-12.5 MG tablet  Take 1 tablet by mouth. 0.5 tab daily    . warfarin (COUMADIN) 5 MG tablet Take 5 mg by mouth daily. Managed by Dr. Manuella Ghazi 2.5 mg 3 times weekly     No current facility-administered medications for this visit.    Allergies  Allergen Reactions  . Codeine   . Nitrofurantoin     Social History   Social History  . Marital Status: Widowed    Spouse Name: N/A  . Number of Children: N/A  . Years of Education: N/A   Occupational History  . Not on file.   Social History Main Topics  . Smoking status: Never Smoker   . Smokeless tobacco: Never Used  . Alcohol Use: No  . Drug Use: No  . Sexual Activity: Not on file   Other Topics Concern  . Not on file   Social History Narrative    Physical Exam: Filed Vitals:   07/16/15 1010  BP: 110/68  Pulse: 71  Height: 5\' 4"  (1.626 m)  Weight: 220 lb (99.791 kg)  SpO2: 97%    GEN- The patient is well appearing, alert and oriented x 3 today.   Head- normocephalic, atraumatic Eyes-  Sclera clear, conjunctiva pink Ears- hearing intact Oropharynx- clear Neck- supple,  Lungs- Clear to ausculation bilaterally, normal work of breathing Chest- pacemaker pocket is well healed Heart- Regular rate and rhythm, no murmurs, rubs or  gallops, PMI not laterally displaced GI- soft, NT, ND, + BS Extremities- no clubbing, cyanosis, or edema  Pacemaker interrogation- reviewed in detail today,  See PACEART report  Assessment and Plan:  1. Permanent afib Rate controlled Continue coumadin long term with coumadin chads2vasc score is at least 5  2. Complete heart block Normal pacemaker function See Pace Art report No changes today She has 6 months to 9 months estimated longevity Continue TTMs Return to see Terrence Dupont in the device clinic in 3 months  3. HTN Stable No change required today  TTMs every 3 months, soon to begin monthly Return to see me in 9 months  Thompson Grayer MD, [facility] 07/16/2015 10:48 AM

## 2015-07-23 ENCOUNTER — Ambulatory Visit (INDEPENDENT_AMBULATORY_CARE_PROVIDER_SITE_OTHER): Payer: Medicare Other | Admitting: Cardiology

## 2015-07-23 ENCOUNTER — Encounter: Payer: Self-pay | Admitting: Cardiology

## 2015-07-23 VITALS — BP 116/75 | HR 78 | Ht 64.0 in | Wt 217.4 lb

## 2015-07-23 DIAGNOSIS — Z8679 Personal history of other diseases of the circulatory system: Secondary | ICD-10-CM | POA: Diagnosis not present

## 2015-07-23 DIAGNOSIS — I482 Chronic atrial fibrillation: Secondary | ICD-10-CM | POA: Diagnosis not present

## 2015-07-23 DIAGNOSIS — I4821 Permanent atrial fibrillation: Secondary | ICD-10-CM

## 2015-07-23 DIAGNOSIS — Z9889 Other specified postprocedural states: Secondary | ICD-10-CM

## 2015-07-23 NOTE — Patient Instructions (Signed)
Your physician recommends that you continue on your current medications as directed. Please refer to the Current Medication list given to you today. Your physician recommends that you schedule a follow-up appointment in: 6 months. You will receive a reminder letter in the mail in about 4 months reminding you to call and schedule your appointment. If you don't receive this letter, please contact our office. 

## 2015-07-23 NOTE — Progress Notes (Signed)
Cardiology Office Note  Date: 07/23/2015   ID: [name] [name], DOB Oct 28, 1928, MRN [id number]  PCP: Monico Blitz, MD  Primary Cardiologist: Rozann Lesches, MD   Chief Complaint  Patient presents with  . Atrial Fibrillation  . Cardiomyopathy    History of Present Illness: [name] [name] is an 82 y.o. female last seen in June 2016. She presents for a routine follow-up visit. She is living with her son and daughter-in-law. Fairly limited in activities now, uses a walker to get around. She denies having any major falls recently.  Recent device follow-up with Dr. Rayann Heman noted. At this point 6-9 months of battery longevity is estimated.  She continues on Coumadin with follow-up per Dr. Manuella Ghazi. No reported bleeding episodes. We reviewed her remaining medications which are outlined below. She does not require any heart rate control medications status post AV node ablation.  Past Medical History  Diagnosis Date  . Type 2 diabetes mellitus ([place])   . Essential hypertension, benign   . Breast cancer ([place])     Bilateral s/p mastectomies  . Permanent atrial fibrillation (HCC)     Status post atrioventricular nodal ablation/St. Jude dual  chamber pacemaker implantation., chronic coumadin therapy followed by Dr. Manuella Ghazi  . Nonischemic cardiomyopathy (HCC)     Question tachycardia mediated  . Hypothyroidism   . Renal insufficiency   . OSA (obstructive sleep apnea)     Past Surgical History  Procedure Laterality Date  . Pacemaker placement      St. Jude - follows with Dr. Rayann Heman    Current Outpatient Prescriptions  Medication Sig Dispense Refill  . ALPRAZolam (XANAX) 0.5 MG tablet Take 0.5 mg by mouth as needed.      . calcium carbonate (OS-CAL) 600 MG TABS tablet Take 600 mg by mouth 2 (two) times daily.    . cetirizine (ZYRTEC) 10 MG tablet Take 10 mg by mouth daily.      . cholecalciferol (VITAMIN D) 1000 UNITS tablet Take 1,000 Units by mouth daily. 2,000 units per day    .  glimepiride (AMARYL) 4 MG tablet Take 4 mg by mouth 2 (two) times daily.      [name] KLOR-CON 10 10 MEQ tablet Take 1 tablet by mouth daily as needed.    [name] levothyroxine (SYNTHROID, LEVOTHROID) 75 MCG tablet Take 75 mcg by mouth daily.      . valsartan-hydrochlorothiazide (DIOVAN-HCT) 160-12.5 MG tablet Take 1 tablet by mouth. 0.5 tab daily    . warfarin (COUMADIN) 5 MG tablet Take 5 mg by mouth daily. Managed by Dr. Manuella Ghazi 2.5 mg 3 times weekly     No current facility-administered medications for this visit.   Allergies:  Codeine and Nitrofurantoin   Social History: The patient  reports that she has never smoked. She has never used smokeless tobacco. She reports that she does not drink alcohol or use illicit drugs.   ROS:  Please see the history of present illness. Otherwise, complete review of systems is positive for arthritic pains.  All other systems are reviewed and negative.   Physical Exam: VS:  BP 116/75 mmHg  Pulse 78  Ht 5\' 4"  (1.626 m)  Wt 217 lb 6.4 oz (98.612 kg)  BMI 37.30 kg/m2  SpO2 91%, BMI Body mass index is 37.3 kg/(m^2).  Wt Readings from Last 3 Encounters:  07/23/15 217 lb 6.4 oz (98.612 kg)  07/16/15 220 lb (99.791 kg)  12/22/14 214 lb (97.07 kg)    Appears comfortable at rest.  HEENT: Conjunctiva and lids normal, oropharynx clear.  Neck: Supple, no elevated JVP or carotid bruits, no thyromegaly.  Lungs: Clear to auscultation, nonlabored breathing at rest.  Cardiac: Regular rate and rhythm, soft systolic murmur, no pericardial rub.  Abdomen: Soft, nontender, bowel sounds present.  Extremities: Chronic appearing edema with compression hose in place, distal pulses 2+.   ECG: ECG is not ordered today.  Recent Labwork:  March 2016: TSH 4.6 , hemoglobin 16.1, platelets 255 , BUN 31, creatinine 1.6, potassium 3.9, AST 22 , ALT 13  Other Studies Reviewed Today:  Echocardiogram 09/22/2010 [facility]): LVEF 50-55% with inferolateral hypokinesis, device wire  present in right heart, MAC, RVSP 25-30 mmHg.  Assessment and Plan:  1. Chronic atrial fibrillation status post AV node ablation with St. Jude pacemaker placement. She continues on Coumadin which is followed by Dr. Manuella Ghazi. She is asymptomatic in terms of palpitations.  2. History of nonischemic cardiomyopathy with improvement in LVEF. Reports NYHA class II dyspnea, functionally limited at baseline.  Current medicines were reviewed with the patient today.  Disposition: FU with me in 6 months.   Signed, Satira Sark, MD, [facility] 07/23/2015 1:58 PM    [facility] at [place], [place], [place] [address] Phone: [phone]; Fax: [phone]

## 2015-10-15 ENCOUNTER — Encounter: Payer: Self-pay | Admitting: Internal Medicine

## 2015-10-15 ENCOUNTER — Ambulatory Visit (INDEPENDENT_AMBULATORY_CARE_PROVIDER_SITE_OTHER): Payer: Medicare Other | Admitting: *Deleted

## 2015-10-15 DIAGNOSIS — I482 Chronic atrial fibrillation: Secondary | ICD-10-CM

## 2015-10-15 DIAGNOSIS — I4821 Permanent atrial fibrillation: Secondary | ICD-10-CM

## 2015-10-15 DIAGNOSIS — I442 Atrioventricular block, complete: Secondary | ICD-10-CM | POA: Diagnosis not present

## 2015-10-15 LAB — CUP PACEART INCLINIC DEVICE CHECK
Battery Impedance: 21300 Ohm
Brady Statistic RV Percent Paced: 100 %
Implantable Lead Implant Date: 20070829
Implantable Lead Location: 753859
Implantable Lead Location: 753860
MDC IDC LEAD IMPLANT DT: 20070829
MDC IDC MSMT BATTERY VOLTAGE: 2.55 V
MDC IDC MSMT LEADCHNL RV IMPEDANCE VALUE: 399 Ohm
MDC IDC PG SERIAL: 1754260
MDC IDC SESS DTM: 20170407133647
MDC IDC SET LEADCHNL RV PACING AMPLITUDE: 2 V
MDC IDC SET LEADCHNL RV PACING PULSEWIDTH: 1.5 ms
MDC IDC SET LEADCHNL RV SENSING SENSITIVITY: 4 mV

## 2015-10-15 NOTE — Progress Notes (Signed)
Pacemaker check in clinic. PPM ERI 08/20/15. RR turned back on due to Ms. Week's notable SOB. Device programmed at appropriate safety margins. Per JA- ok to schedule generator replacement without OV.

## 2015-10-20 ENCOUNTER — Telehealth: Payer: Self-pay | Admitting: *Deleted

## 2015-10-20 NOTE — Telephone Encounter (Signed)
Called and spoke with patient her last INR was 3/28  Will have another with Dr Manuella Ghazi 4/13 PPM gen change with Dr Rayann Heman on 4/18 at 10:30 Please report to [facility] Main Entrance of [place] at 8:30am

## 2015-10-21 NOTE — Telephone Encounter (Signed)
Spoke with patient and her INR 2.1 She has all of her labs and is bringing with her from Dr Manuella Ghazi

## 2015-10-26 ENCOUNTER — Encounter (HOSPITAL_COMMUNITY): Payer: Self-pay | Admitting: Internal Medicine

## 2015-10-26 ENCOUNTER — Ambulatory Visit (HOSPITAL_COMMUNITY)
Admission: RE | Admit: 2015-10-26 | Discharge: 2015-10-26 | Disposition: A | Discharge status: Home / Self Care | Payer: Medicare Other | Source: Ambulatory Visit | Attending: Internal Medicine | Admitting: Internal Medicine

## 2015-10-26 ENCOUNTER — Encounter (HOSPITAL_COMMUNITY)
Admission: RE | Disposition: A | Discharge status: Home / Self Care | Payer: Self-pay | Source: Ambulatory Visit | Attending: Internal Medicine

## 2015-10-26 DIAGNOSIS — E039 Hypothyroidism, unspecified: Secondary | ICD-10-CM | POA: Diagnosis not present

## 2015-10-26 DIAGNOSIS — I428 Other cardiomyopathies: Secondary | ICD-10-CM | POA: Diagnosis not present

## 2015-10-26 DIAGNOSIS — E119 Type 2 diabetes mellitus without complications: Secondary | ICD-10-CM | POA: Diagnosis not present

## 2015-10-26 DIAGNOSIS — Z7901 Long term (current) use of anticoagulants: Secondary | ICD-10-CM | POA: Diagnosis not present

## 2015-10-26 DIAGNOSIS — Z45010 Encounter for checking and testing of cardiac pacemaker pulse generator [battery]: Secondary | ICD-10-CM | POA: Insufficient documentation

## 2015-10-26 DIAGNOSIS — Z9013 Acquired absence of bilateral breasts and nipples: Secondary | ICD-10-CM | POA: Diagnosis not present

## 2015-10-26 DIAGNOSIS — Z853 Personal history of malignant neoplasm of breast: Secondary | ICD-10-CM | POA: Diagnosis not present

## 2015-10-26 DIAGNOSIS — I10 Essential (primary) hypertension: Secondary | ICD-10-CM | POA: Diagnosis not present

## 2015-10-26 DIAGNOSIS — I442 Atrioventricular block, complete: Secondary | ICD-10-CM | POA: Diagnosis not present

## 2015-10-26 DIAGNOSIS — G4733 Obstructive sleep apnea (adult) (pediatric): Secondary | ICD-10-CM | POA: Diagnosis not present

## 2015-10-26 DIAGNOSIS — I482 Chronic atrial fibrillation: Secondary | ICD-10-CM | POA: Diagnosis not present

## 2015-10-26 HISTORY — PX: EP IMPLANTABLE DEVICE: SHX172B

## 2015-10-26 LAB — CBC
HEMATOCRIT: 51.3 % — AB (ref 36.0–46.0)
Hemoglobin: 16.8 g/dL — ABNORMAL HIGH (ref 12.0–15.0)
MCH: 30.8 pg (ref 26.0–34.0)
MCHC: 32.7 g/dL (ref 30.0–36.0)
MCV: 94 fL (ref 78.0–100.0)
Platelets: 181 10*3/uL (ref 150–400)
RBC: 5.46 MIL/uL — AB (ref 3.87–5.11)
RDW: 14.1 % (ref 11.5–15.5)
WBC: 7.6 10*3/uL (ref 4.0–10.5)

## 2015-10-26 LAB — PROTIME-INR
INR: 1.19 (ref 0.00–1.49)
Prothrombin Time: 15.3 seconds — ABNORMAL HIGH (ref 11.6–15.2)

## 2015-10-26 LAB — BASIC METABOLIC PANEL
ANION GAP: 11 (ref 5–15)
BUN: 25 mg/dL — AB (ref 6–20)
CO2: 25 mmol/L (ref 22–32)
Calcium: 9.8 mg/dL (ref 8.9–10.3)
Chloride: 104 mmol/L (ref 101–111)
Creatinine, Ser: 1.27 mg/dL — ABNORMAL HIGH (ref 0.44–1.00)
GFR calc Af Amer: 44 mL/min — ABNORMAL LOW (ref >60)
GFR calc non Af Amer: 38 mL/min — ABNORMAL LOW (ref >60)
GLUCOSE: 197 mg/dL — AB (ref 65–99)
POTASSIUM: 3.7 mmol/L (ref 3.5–5.1)
Sodium: 140 mmol/L (ref 135–145)

## 2015-10-26 LAB — GLUCOSE, CAPILLARY
GLUCOSE-CAPILLARY: 141 mg/dL — AB (ref 65–99)
Glucose-Capillary: 130 mg/dL — ABNORMAL HIGH (ref 65–99)

## 2015-10-26 LAB — SURGICAL PCR SCREEN
MRSA, PCR: NEGATIVE
Staphylococcus aureus: POSITIVE — AB

## 2015-10-26 SURGERY — PPM/BIV PPM GENERATOR CHANGEOUT
Anesthesia: LOCAL

## 2015-10-26 MED ORDER — MIDAZOLAM HCL 5 MG/5ML IJ SOLN
INTRAMUSCULAR | Status: AC
  Filled 2015-10-26: qty 5

## 2015-10-26 MED ORDER — CEFAZOLIN SODIUM-DEXTROSE 2-4 GM/100ML-% IV SOLN
2.0000 g | INTRAVENOUS | Status: AC
  Administered 2015-10-26: 2 g via INTRAVENOUS

## 2015-10-26 MED ORDER — HYDRALAZINE HCL 20 MG/ML IJ SOLN
INTRAMUSCULAR | Status: DC | PRN
  Administered 2015-10-26: 20 mg via INTRAVENOUS

## 2015-10-26 MED ORDER — MUPIROCIN 2 % EX OINT
TOPICAL_OINTMENT | Freq: Once | CUTANEOUS | Status: AC
  Administered 2015-10-26: 1 via NASAL

## 2015-10-26 MED ORDER — SODIUM CHLORIDE 0.9 % IV SOLN
INTRAVENOUS | Status: DC
  Administered 2015-10-26: 09:00:00 via INTRAVENOUS

## 2015-10-26 MED ORDER — HYDRALAZINE HCL 20 MG/ML IJ SOLN
INTRAMUSCULAR | Status: AC
  Filled 2015-10-26: qty 1

## 2015-10-26 MED ORDER — FENTANYL CITRATE (PF) 100 MCG/2ML IJ SOLN
INTRAMUSCULAR | Status: AC
  Filled 2015-10-26: qty 2

## 2015-10-26 MED ORDER — MUPIROCIN 2 % EX OINT
TOPICAL_OINTMENT | CUTANEOUS | Status: AC
  Administered 2015-10-26: 1 via NASAL
  Filled 2015-10-26: qty 22

## 2015-10-26 MED ORDER — ACETAMINOPHEN 325 MG PO TABS: 325.0000 mg | ORAL_TABLET | ORAL | Status: DC | PRN

## 2015-10-26 MED ORDER — SODIUM CHLORIDE 0.9 % IR SOLN
Status: AC
  Filled 2015-10-26: qty 2

## 2015-10-26 MED ORDER — LIDOCAINE HCL (PF) 1 % IJ SOLN
INTRAMUSCULAR | Status: DC | PRN
  Administered 2015-10-26: 31 mL

## 2015-10-26 MED ORDER — ONDANSETRON HCL 4 MG/2ML IJ SOLN: 4.0000 mg | Freq: Four times a day (QID) | INTRAMUSCULAR | Status: DC | PRN

## 2015-10-26 MED ORDER — CEFAZOLIN SODIUM-DEXTROSE 2-3 GM-% IV SOLR
INTRAVENOUS | Status: AC
  Filled 2015-10-26: qty 50

## 2015-10-26 MED ORDER — SODIUM CHLORIDE 0.9% FLUSH: 3.0000 mL | Freq: Two times a day (BID) | INTRAVENOUS | Status: DC

## 2015-10-26 MED ORDER — CHLORHEXIDINE GLUCONATE 4 % EX LIQD: 60.0000 mL | Freq: Once | CUTANEOUS | Status: DC

## 2015-10-26 MED ORDER — SODIUM CHLORIDE 0.9 % IV SOLN: 250.0000 mL | INTRAVENOUS | Status: DC | PRN

## 2015-10-26 MED ORDER — LIDOCAINE HCL (PF) 1 % IJ SOLN
INTRAMUSCULAR | Status: AC
  Filled 2015-10-26: qty 60

## 2015-10-26 MED ORDER — SODIUM CHLORIDE 0.9% FLUSH: 3.0000 mL | INTRAVENOUS | Status: DC | PRN

## 2015-10-26 MED ORDER — SODIUM CHLORIDE 0.9 % IR SOLN
80.0000 mg | Status: AC
  Administered 2015-10-26: 80 mg

## 2015-10-26 SURGICAL SUPPLY — 4 items
CABLE SURGICAL S-101-97-12 (CABLE) ×1 IMPLANT
PAD DEFIB LIFELINK (PAD) ×1 IMPLANT
PPM ASSURITY SR PM1240 (Pacemaker) ×1 IMPLANT
TRAY PACEMAKER INSERTION (PACKS) ×1 IMPLANT

## 2015-10-26 NOTE — H&P (Signed)
  PCP: Monico Blitz, MD Primary Cardiologist:  Dr Domenic Polite  The patient presents today for pacemaker generator change. She has recently reached ERI batter status. Today, she denies symptoms of palpitations, chest pain, shortness of breath, orthopnea, PND, lower extremity edema, dizziness, presyncope, syncope, or neurologic sequela. The patient feels that she is tolerating medications without difficulties and is otherwise without complaint today.   Past Medical History  Diagnosis Date  . Type 2 diabetes mellitus ([place])   . Essential hypertension, benign   . Breast cancer ([place])     Bilateral s/p mastectomies  . Permanent atrial fibrillation (HCC)     Status post atrioventricular nodal ablation/St. Jude dual chamber pacemaker implantation., chronic coumadin therapy followed by Dr. Manuella Ghazi  . Nonischemic cardiomyopathy (HCC)     Question tachycardia mediated  . Hypothyroidism   . Renal insufficiency   . OSA (obstructive sleep apnea)    Past Surgical History  Procedure Laterality Date  . Pacemaker placement      St. Jude - follows with Dr. Rayann Heman    Medicines reviewed with patient     Allergies  Allergen Reactions  . Codeine   . Nitrofurantoin     Social History   Social History  . Marital Status: Widowed    Spouse Name: N/A  . Number of Children: N/A  . Years of Education: N/A   Occupational History  . Not on file.   Social History Main Topics  . Smoking status: Never Smoker   . Smokeless tobacco: Never Used  . Alcohol Use: No  . Drug Use: No  . Sexual Activity: Not on file   Other Topics Concern  . Not on file   Social History Narrative    Physical Exam:                      Filed Vitals:   10/26/15 0825 10/26/15 0853  BP: 174/93 153/90  Pulse: 70 69  Temp: 97.5 F (36.4 C)   Resp: 18    GEN- The patient is well appearing, alert and  oriented x 3 today.  Head- normocephalic, atraumatic Eyes- Sclera clear, conjunctiva pink Ears- hearing intact Oropharynx- clear Neck- supple,  Lungs- Clear to ausculation bilaterally, normal work of breathing Chest- pacemaker pocket is well healed Heart- Regular rate and rhythm, no murmurs, rubs or gallops, PMI not laterally displaced GI- soft, NT, ND, + BS Extremities- no clubbing, cyanosis, or edema  Pacemaker interrogation- reviewed in detail today, See PACEART report  Assessment and Plan:  1. Permanent afib Rate controlled Continue coumadin long term with coumadin chads2vasc score is at least 5  2. Complete heart block Pacemaker has reached ERI Risks, benefits, and alternatives to  pacemaker pulse generator replacement were discussed in detail today.  The patient understands that risks include but are not limited to bleeding, infection, pneumothorax, perforation, tamponade, vascular damage, renal failure, MI, stroke, death, damage to his existing leads, and lead dislodgement and wishes to proceed.      Thompson Grayer MD, [facility] 10/26/2015 11:37 AM

## 2015-10-26 NOTE — Research (Signed)
[facility] RESEARCH STUDY Informed Consent   Subject Name: [name] [name]  Subject met inclusion and exclusion criteria.  The informed consent form, study requirements and expectations were reviewed with the subject and questions and concerns were addressed prior to the signing of the consent form.  The subject verbalized understanding of the trial requirements.  The subject agreed to participate in the [facility] trial and signed the informed consent.  The informed consent was obtained prior to performance of any protocol-specific procedures for the subject.  A copy of the signed informed consent was given to the subject and a copy was placed in the subject's medical record.  Desmond Dike H 10/26/2015, 07:00 AM

## 2015-10-26 NOTE — Discharge Instructions (Signed)
Pacemaker Battery Change, Care After °Refer to this sheet in the next few [place]. These instructions provide you with information on caring for yourself after your procedure. Your health care provider may also give you more specific instructions. Your treatment has been planned according to current medical practices, but problems sometimes occur. Call your health care provider if you have any problems or questions after your procedure. °WHAT TO EXPECT AFTER THE PROCEDURE °After your procedure, it is typical to have the following sensations: °· Soreness at the pacemaker site. °HOME CARE INSTRUCTIONS  °· Keep the incision clean and dry. °· Unless advised otherwise, you may shower beginning 48 hours after your procedure. °· For the first week after the replacement, avoid stretching motions that pull at the incision site, and avoid heavy exercise with the arm that is on the same side as the incision. °· Take medicines only as directed by your health care provider. °· Keep all follow-up visits as directed by your health care provider. °SEEK MEDICAL CARE IF:  °· You have pain at the incision site that is not relieved by over-the-counter or prescription medicine. °· There is drainage or pus from the incision site. °· There is swelling larger than a lime at the incision site. °· You develop red streaking that extends above or below the incision site. °· You feel brief, intermittent palpitations, light-headedness, or any symptoms that you feel might be related to your heart. °SEEK IMMEDIATE MEDICAL CARE IF:  °· You experience chest pain that is different than the pain at the pacemaker site. °· You experience shortness of breath. °· You have palpitations or irregular heartbeat. °· You have light-headedness that does not go away quickly. °· You faint. °· You have pain that gets worse and is not relieved by medicine. °  °This information is not intended to replace advice given to you by your health care provider. Make sure you  discuss any questions you have with your health care provider. °  °Document Released: 04/16/2013 Document Revised: 07/17/2014 Document Reviewed: 04/16/2013 °Elsevier Interactive Patient Education ©2016 Elsevier Inc. ° °

## 2015-10-27 MED FILL — Sodium Chloride Irrigation Soln 0.9%: Qty: 500 | Status: AC

## 2015-10-27 MED FILL — Gentamicin Sulfate Inj 40 MG/ML: INTRAMUSCULAR | Qty: 2 | Status: AC

## 2015-11-03 ENCOUNTER — Ambulatory Visit (INDEPENDENT_AMBULATORY_CARE_PROVIDER_SITE_OTHER): Payer: Medicare Other | Admitting: *Deleted

## 2015-11-03 DIAGNOSIS — I482 Chronic atrial fibrillation: Secondary | ICD-10-CM | POA: Diagnosis not present

## 2015-11-03 DIAGNOSIS — I4821 Permanent atrial fibrillation: Secondary | ICD-10-CM

## 2015-11-03 DIAGNOSIS — I442 Atrioventricular block, complete: Secondary | ICD-10-CM

## 2015-11-03 NOTE — Progress Notes (Signed)
Wound check appointment. Steri-strips removed. Wound without redness or edema. Incision edges approximated, wound well healed. Pacemaker check in clinic. Normal device function. Thresholds, sensing, impedances consistent with previous measurements. Device programmed to maximize longevity. No high ventricular rates noted. Device programmed at appropriate safety margins. Histogram distribution appropriate for patient activity level. Device programmed to optimize intrinsic conduction. Estimated longevity 9.4-10.88yrs. ROV with JA 7/14.

## 2016-01-21 ENCOUNTER — Ambulatory Visit (INDEPENDENT_AMBULATORY_CARE_PROVIDER_SITE_OTHER): Payer: Medicare Other | Admitting: Internal Medicine

## 2016-01-21 ENCOUNTER — Ambulatory Visit (INDEPENDENT_AMBULATORY_CARE_PROVIDER_SITE_OTHER): Payer: Medicare Other | Admitting: Cardiology

## 2016-01-21 ENCOUNTER — Encounter: Payer: Self-pay | Admitting: Internal Medicine

## 2016-01-21 VITALS — BP 118/70 | HR 74 | Ht 64.0 in | Wt 227.0 lb

## 2016-01-21 DIAGNOSIS — Z8679 Personal history of other diseases of the circulatory system: Secondary | ICD-10-CM | POA: Diagnosis not present

## 2016-01-21 DIAGNOSIS — I482 Chronic atrial fibrillation: Secondary | ICD-10-CM

## 2016-01-21 DIAGNOSIS — I10 Essential (primary) hypertension: Secondary | ICD-10-CM | POA: Diagnosis not present

## 2016-01-21 DIAGNOSIS — I4821 Permanent atrial fibrillation: Secondary | ICD-10-CM

## 2016-01-21 DIAGNOSIS — Z9889 Other specified postprocedural states: Secondary | ICD-10-CM

## 2016-01-21 DIAGNOSIS — I442 Atrioventricular block, complete: Secondary | ICD-10-CM | POA: Diagnosis not present

## 2016-01-21 LAB — CUP PACEART INCLINIC DEVICE CHECK
Battery Remaining Longevity: 111.6
Battery Voltage: 3.05 V
Implantable Lead Location: 753859
Lead Channel Pacing Threshold Amplitude: 1 V
Lead Channel Pacing Threshold Pulse Width: 0.5 ms
Lead Channel Pacing Threshold Pulse Width: 0.5 ms
Lead Channel Setting Sensing Sensitivity: 4 mV
MDC IDC LEAD IMPLANT DT: 20070829
MDC IDC LEAD IMPLANT DT: 20070829
MDC IDC LEAD LOCATION: 753860
MDC IDC MSMT LEADCHNL RV IMPEDANCE VALUE: 562.5 Ohm
MDC IDC MSMT LEADCHNL RV PACING THRESHOLD AMPLITUDE: 1 V
MDC IDC SESS DTM: 20170714132102
MDC IDC SET LEADCHNL RV PACING AMPLITUDE: 2.5 V
MDC IDC SET LEADCHNL RV PACING PULSEWIDTH: 0.5 ms
MDC IDC STAT BRADY RV PERCENT PACED: 99.94 %
Pulse Gen Model: 1240
Pulse Gen Serial Number: 7891166

## 2016-01-21 NOTE — Patient Instructions (Addendum)
Medication Instructions:  Your physician recommends that you continue on your current medications as directed. Please refer to the Current Medication list given to you today.   Labwork: NONE  Testing/Procedures: NONE  Follow-Up: Your physician recommends that you schedule a follow-up appointment in: 1 year with Dr. Allred. Please schedule this appointment today before leaving the office. Your next device check is on 04/24/16.  Any Other Special Instructions Will Be Listed Below (If Applicable).  If you need a refill on your cardiac medications before your next appointment, please call your pharmacy. 

## 2016-01-21 NOTE — Progress Notes (Signed)
PCP:  Monico Blitz, MD Primary Cardiologist:  Dr Domenic Polite Primary EP: Winona Sison  The patient presents today for routine electrophysiology followup.  Since last being seen in our clinic, the patient reports doing very well.  She is doing well currently.  She is pleased that she healed from her recent PPM gen change without issues.Today, she denies symptoms of palpitations, chest pain, shortness of breath, orthopnea, PND, lower extremity edema, dizziness, presyncope, syncope, or neurologic sequela.  The patient feels that she is tolerating medications without difficulties and is otherwise without complaint today.   Past Medical History  Diagnosis Date  . Type 2 diabetes mellitus ([place])   . Essential hypertension, benign   . Breast cancer ([place])     Bilateral s/p mastectomies  . Permanent atrial fibrillation (HCC)     Status post atrioventricular nodal ablation/St. Jude dual  chamber pacemaker implantation., chronic coumadin therapy followed by Dr. Manuella Ghazi  . Nonischemic cardiomyopathy (HCC)     Question tachycardia mediated  . Hypothyroidism   . Renal insufficiency   . OSA (obstructive sleep apnea)    Past Surgical History  Procedure Laterality Date  . Pacemaker placement      St. Jude - follows with Dr. Rayann Heman  . Ep implantable device N/A 10/26/2015    St Jude Assurity pacemaker generator change by Dr Rayann Heman    Current Outpatient Prescriptions  Medication Sig Dispense Refill  . allopurinol (ZYLOPRIM) 100 MG tablet Take 100 mg by mouth daily.    [name] ALPRAZolam (XANAX) 0.5 MG tablet Take 0.5 mg by mouth as needed for anxiety.     . cetirizine (ZYRTEC) 10 MG tablet Take 10 mg by mouth daily.      . cholecalciferol (VITAMIN D) 1000 UNITS tablet Take 2,000 Units by mouth daily. Reported on 10/21/2015    . glimepiride (AMARYL) 4 MG tablet Take 4 mg by mouth 2 (two) times daily.      [name] levothyroxine (SYNTHROID, LEVOTHROID) 75 MCG tablet Take 75 mcg by mouth daily.      [name]  losartan-hydrochlorothiazide (HYZAAR) 50-12.5 MG tablet Take 1 tablet by mouth daily.    . pravastatin (PRAVACHOL) 20 MG tablet Take 20 mg by mouth daily.    . sitaGLIPtin (JANUVIA) 100 MG tablet Take 100 mg by mouth every morning.     . VENTOLIN HFA 108 (90 Base) MCG/ACT inhaler Inhale 1 puff into the lungs every 6 (six) hours as needed for wheezing or shortness of breath.   0  . warfarin (COUMADIN) 5 MG tablet Take 5 mg by mouth daily at 6 PM. Managed by Dr. Manuella Ghazi,  2.5 mg on Mon, Wed and Fri, 5 mg all other days     No current facility-administered medications for this visit.    Allergies  Allergen Reactions  . Codeine Nausea And Vomiting  . Nitrofurantoin Other (See Comments)    Unknown    Social History   Social History  . Marital Status: Widowed    Spouse Name: N/A  . Number of Children: N/A  . Years of Education: N/A   Occupational History  . Not on file.   Social History Main Topics  . Smoking status: Never Smoker   . Smokeless tobacco: Never Used  . Alcohol Use: No  . Drug Use: No  . Sexual Activity: Not on file   Other Topics Concern  . Not on file   Social History Narrative    Physical Exam: Filed Vitals:   01/21/16 1133  BP: 118/70  Pulse: 74  Height: 5\' 4"  (1.626 m)  Weight: 227 lb (102.967 kg)  SpO2: 94%    GEN- The patient is well appearing, alert and oriented x 3 today.   Head- normocephalic, atraumatic Eyes-  Sclera clear, conjunctiva pink Ears- hearing intact Oropharynx- clear Neck- supple,  Lungs- Clear to ausculation bilaterally, normal work of breathing Chest- pacemaker pocket is well healed Heart- Regular rate and rhythm, no murmurs, rubs or gallops, PMI not laterally displaced GI- soft, NT, ND, + BS Extremities- no clubbing, cyanosis, or edema  Pacemaker interrogation- reviewed in detail today,  See PACEART report  Assessment and Plan:  1. Permanent afib Rate controlled Continue coumadin long term with coumadin chads2vasc  score is at least 5  2. Complete heart block Normal pacemaker function See Pace Art report No changes today  3. HTN Stable No change required today  Remote monitoring with merlin Return to see me in 12 months She will see Dr Domenic Polite today as scheduled  Thompson Grayer MD, [facility] 01/21/2016 12:12 PM

## 2016-01-21 NOTE — Progress Notes (Signed)
Cardiology Office Note  Date: 01/21/2016   ID: [name] [name], DOB Aug 25, 1928, MRN [id number]  PCP: Monico Blitz, MD  Primary Cardiologist: Rozann Lesches, MD   Chief Complaint  Patient presents with  . Atrial Fibrillation  . History of cardiomyopathy    History of Present Illness: [name] [name] is an 82 y.o. female last seen in January. She presents for a routine follow-up visit. Reports no significant sense of palpitations or chest pain. She lives with her son and daughter-in-law, reports a good situation with everything that she needs close at hand. She uses a walker to get around, reports NYHA class 2-3 dyspnea which is chronic.  She follows with Dr. Rayann Heman, in fact was seen concurrently today for device check after recent generator change.  She continues on Coumadin with follow-up per Dr. Manuella Ghazi. Reported bleeding problems.  Past Medical History  Diagnosis Date  . Type 2 diabetes mellitus ([place])   . Essential hypertension, benign   . Breast cancer ([place])     Bilateral s/p mastectomies  . Permanent atrial fibrillation (HCC)     Status post atrioventricular nodal ablation/St. Jude dual  chamber pacemaker implantation., chronic coumadin therapy followed by Dr. Manuella Ghazi  . Nonischemic cardiomyopathy (HCC)     Question tachycardia mediated  . Hypothyroidism   . Renal insufficiency   . OSA (obstructive sleep apnea)     Past Surgical History  Procedure Laterality Date  . Pacemaker placement      St. Jude - follows with Dr. Rayann Heman  . Ep implantable device N/A 10/26/2015    St Jude Assurity pacemaker generator change by Dr Rayann Heman    Current Outpatient Prescriptions  Medication Sig Dispense Refill  . allopurinol (ZYLOPRIM) 100 MG tablet Take 100 mg by mouth daily.    [name] ALPRAZolam (XANAX) 0.5 MG tablet Take 0.5 mg by mouth as needed for anxiety.     . cetirizine (ZYRTEC) 10 MG tablet Take 10 mg by mouth daily.      . cholecalciferol (VITAMIN D) 1000 UNITS tablet Take  2,000 Units by mouth daily. Reported on 10/21/2015    . glimepiride (AMARYL) 4 MG tablet Take 4 mg by mouth 2 (two) times daily.      [name] levothyroxine (SYNTHROID, LEVOTHROID) 75 MCG tablet Take 75 mcg by mouth daily.      [name] losartan-hydrochlorothiazide (HYZAAR) 50-12.5 MG tablet Take 1 tablet by mouth daily.    . pravastatin (PRAVACHOL) 20 MG tablet Take 20 mg by mouth daily.    . sitaGLIPtin (JANUVIA) 100 MG tablet Take 100 mg by mouth every morning.     . VENTOLIN HFA 108 (90 Base) MCG/ACT inhaler Inhale 1 puff into the lungs every 6 (six) hours as needed for wheezing or shortness of breath.   0  . warfarin (COUMADIN) 5 MG tablet Take 5 mg by mouth daily at 6 PM. Managed by Dr. Manuella Ghazi,  2.5 mg on Mon, Wed and Fri, 5 mg all other days     No current facility-administered medications for this visit.   Allergies:  Codeine and Nitrofurantoin   Social History: The patient  reports that she has never smoked. She has never used smokeless tobacco. She reports that she does not drink alcohol or use illicit drugs.   ROS:  Please see the history of present illness. Otherwise, complete review of systems is positive for chronic back pain.  All other systems are reviewed and negative.   Physical Exam: VS:  BP 118/70 mmHg  Pulse  74  Ht 5\' 4"  (1.626 m)  Wt 227 lb (102.967 kg)  BMI 38.95 kg/m2  SpO2 94%, BMI Body mass index is 38.95 kg/(m^2).  Wt Readings from Last 3 Encounters:  01/21/16 227 lb (102.967 kg)  01/21/16 227 lb (102.967 kg)  10/26/15 217 lb (98.431 kg)    Appears comfortable at rest.  HEENT: Conjunctiva and lids normal, oropharynx clear.  Neck: Supple, no elevated JVP or carotid bruits, no thyromegaly.  Lungs: Clear to auscultation, nonlabored breathing at rest.  Cardiac: Regular rate and rhythm, soft systolic murmur, no pericardial rub.  Abdomen: Soft, nontender, bowel sounds present.  Extremities: Chronic appearing edema with compression hose in place, distal pulses  2+.  ECG: I personally reviewed the tracing from 06/16/2014 which showed a ventricular paced rhythm.  Recent Labwork: 10/26/2015: BUN 25*; Creatinine, Ser 1.27*; Hemoglobin 16.8*; Platelets 181; Potassium 3.7; Sodium 140   Other Studies Reviewed Today:  Echocardiogram 09/22/2010 [facility]): LVEF 50-55% with inferolateral hypokinesis, device wire present in right heart, MAC, RVSP 25-30 mmHg.  Assessment and Plan:  1. Chronic atrial fibrillation status post AV node ablation and placement of St. Jude pacemaker. She follows with Dr. Rayann Heman, had a recent generator change. Reports no symptoms of palpitations and remains on Coumadin for stroke prophylaxis (followed by Dr. Manuella Ghazi).  2. History of nonischemic cardiomyopathy with normalization of LVEF. She has not had a recent echocardiogram, but does not report any significant decline in stamina. We will continue with observation.  3. Essential hypertension, blood pressure well controlled today.  Current medicines were reviewed with the patient today.  Disposition: Follow-up with me in 6 months.  Signed, Satira Sark, MD, [facility] 01/21/2016 12:24 PM    [facility] [address], [place], [place] [address] Phone: [phone]; Fax: [phone]

## 2016-01-21 NOTE — Patient Instructions (Signed)

## 2016-01-28 ENCOUNTER — Telehealth: Payer: Self-pay | Admitting: Cardiology

## 2016-01-28 NOTE — Telephone Encounter (Signed)
Follow-up      The pt is returning Barbara's call

## 2016-01-28 NOTE — Telephone Encounter (Signed)
LMOVM informing pt that St Jude rep informed me that she had already helped her w/ her home monitor.

## 2016-01-28 NOTE — Telephone Encounter (Signed)
LMOVM for pt to return my call regard her home monitor.

## 2016-04-24 ENCOUNTER — Ambulatory Visit (INDEPENDENT_AMBULATORY_CARE_PROVIDER_SITE_OTHER): Payer: Medicare Other | Admitting: *Deleted

## 2016-04-24 DIAGNOSIS — I442 Atrioventricular block, complete: Secondary | ICD-10-CM

## 2016-04-24 NOTE — Progress Notes (Signed)
Remote pacemaker transmission.   

## 2016-04-26 ENCOUNTER — Encounter: Payer: Self-pay | Admitting: Cardiology

## 2016-05-05 LAB — CUP PACEART REMOTE DEVICE CHECK
Battery Voltage: 3.02 V
Brady Statistic RV Percent Paced: 99 %
Implantable Lead Implant Date: 20070829
Implantable Lead Implant Date: 20070829
Lead Channel Impedance Value: 580 Ohm
Lead Channel Setting Sensing Sensitivity: 4 mV
MDC IDC LEAD LOCATION: 753859
MDC IDC LEAD LOCATION: 753860
MDC IDC MSMT BATTERY REMAINING LONGEVITY: 110 mo
MDC IDC MSMT BATTERY REMAINING PERCENTAGE: 95 % — AB
MDC IDC SESS DTM: 20171027111303
MDC IDC SET LEADCHNL RV PACING AMPLITUDE: 2.5 V
MDC IDC SET LEADCHNL RV PACING PULSEWIDTH: 0.5 ms
Pulse Gen Serial Number: 7891166

## 2016-07-14 ENCOUNTER — Ambulatory Visit: Payer: Medicare Other | Admitting: Internal Medicine

## 2016-07-14 ENCOUNTER — Encounter: Payer: Medicare Other | Admitting: Internal Medicine

## 2016-07-24 ENCOUNTER — Ambulatory Visit (INDEPENDENT_AMBULATORY_CARE_PROVIDER_SITE_OTHER): Payer: Medicare Other | Admitting: *Deleted

## 2016-07-24 DIAGNOSIS — I442 Atrioventricular block, complete: Secondary | ICD-10-CM | POA: Diagnosis not present

## 2016-07-28 LAB — CUP PACEART REMOTE DEVICE CHECK
Battery Remaining Longevity: 109 mo
Battery Remaining Percentage: 95 %
Brady Statistic RV Percent Paced: 99 %
Implantable Lead Implant Date: 20070829
Implantable Lead Location: 753860
Lead Channel Setting Sensing Sensitivity: 4 mV
MDC IDC LEAD IMPLANT DT: 20070829
MDC IDC LEAD LOCATION: 753859
MDC IDC MSMT BATTERY VOLTAGE: 3.02 V
MDC IDC MSMT LEADCHNL RV IMPEDANCE VALUE: 580 Ohm
MDC IDC PG IMPLANT DT: 20170418
MDC IDC SESS DTM: 20180119163853
MDC IDC SET LEADCHNL RV PACING AMPLITUDE: 2.5 V
MDC IDC SET LEADCHNL RV PACING PULSEWIDTH: 0.5 ms
Pulse Gen Serial Number: 7891166

## 2016-07-28 NOTE — Progress Notes (Signed)
Remote pacemaker transmission.   

## 2016-08-02 ENCOUNTER — Encounter: Payer: Self-pay | Admitting: Cardiology

## 2016-08-10 ENCOUNTER — Ambulatory Visit: Payer: Medicare Other | Admitting: Cardiology

## 2016-08-31 NOTE — Progress Notes (Signed)
Cardiology Office Note  Date: 09/01/2016   [name], DOB [birth year], MRN [id number]  PCP: Monico Blitz, MD  Primary Cardiologist: Rozann Lesches, MD   Chief Complaint  Patient presents with  . Atrial Fibrillation    History of Present Illness: [name] [name] is an 83 y.o. female last seen in July 2017. She is here today with her son for a follow-up visit. Reports no changes from a cardiac perspective, specifically no palpitations, dizziness, or unusual shortness of breath.  She follows a device clinic with Dr. Rayann Heman, [place] pacemaker in place. Recent device check showed normal function.  I reviewed her current medications which are outlined below. She is now on insulin, recently started by Dr. Manuella Ghazi. Pravachol is no longer on her list, she does not recall why this was stopped. I asked her to talk with Dr. Manuella Ghazi the next time she sees him. Her last LDL was in the 130s, ideally should be at least under 100 with diabetes mellitus.  Past Medical History:  Diagnosis Date  . Breast cancer ([place])    Bilateral s/p mastectomies  . Essential hypertension, benign   . Hypothyroidism   . Nonischemic cardiomyopathy (HCC)    Question tachycardia mediated  . OSA (obstructive sleep apnea)   . Permanent atrial fibrillation (HCC)    Status post atrioventricular nodal ablation/St. Jude dual  chamber pacemaker implantation., chronic coumadin therapy followed by Dr. Manuella Ghazi  . Renal insufficiency   . Type 2 diabetes mellitus ([place])     Past Surgical History:  Procedure Laterality Date  . EP IMPLANTABLE DEVICE N/A 10/26/2015   St Jude Assurity pacemaker generator change by Dr Rayann Heman  . PACEMAKER PLACEMENT     St. Jude - follows with Dr. Rayann Heman    Current Outpatient Prescriptions  Medication Sig Dispense Refill  . allopurinol (ZYLOPRIM) 100 MG tablet Take 100 mg by mouth daily.    [name] ALPRAZolam (XANAX) 0.5 MG tablet Take 0.5 mg by mouth at bedtime.     . cetirizine (ZYRTEC) 10  MG tablet Take 10 mg by mouth daily.      . Cholecalciferol (VITAMIN D3) 5000 units CAPS Take 1 capsule by mouth daily.    [name] glimepiride (AMARYL) 4 MG tablet Take 4 mg by mouth 2 (two) times daily.      . insulin degludec (TRESIBA FLEXTOUCH) 100 UNIT/ML SOPN FlexTouch Pen Inject 5 Units into the skin at bedtime.    [name] levothyroxine (SYNTHROID, LEVOTHROID) 88 MCG tablet Take 88 mcg by mouth daily before breakfast.    . losartan-hydrochlorothiazide (HYZAAR) 50-12.5 MG tablet Take 1 tablet by mouth daily.    . metFORMIN (GLUCOPHAGE) 500 MG tablet Take 500 mg by mouth every evening.    . VENTOLIN HFA 108 (90 Base) MCG/ACT inhaler Inhale 1 puff into the lungs every 6 (six) hours as needed for wheezing or shortness of breath.   0  . warfarin (COUMADIN) 5 MG tablet Take 5 mg by mouth daily at 6 PM. Managed by Dr. Manuella Ghazi,  2.5 mg on Mon, Wed and Fri, 5 mg all other days     No current facility-administered medications for this visit.    Allergies:  Codeine and Nitrofurantoin   Social History: The patient  reports that she has never smoked. She has never used smokeless tobacco. She reports that she does not drink alcohol or use drugs.   ROS:  Please see the history of present illness. Otherwise, complete review of systems is positive for  hearing loss.  All other systems are reviewed and negative.   Physical Exam: VS:  BP 125/77   Pulse 74   Ht _0  (1.626 m)   Wt 226 lb 3.2 oz (102.6 kg)   SpO2 94%   BMI 38.83 kg/m , BMI Body mass index is 38.83 kg/m.  Wt Readings from Last 3 Encounters:  09/01/16 226 lb 3.2 oz (102.6 kg)  01/21/16 227 lb (103 kg)  01/21/16 227 lb (103 kg)    Appears comfortable at rest. Uses a walker. HEENT: Conjunctiva and lids normal, oropharynx clear.  Neck: Supple, no elevated JVP or carotid bruits, no thyromegaly.  Lungs: Clear to auscultation, nonlabored breathing at rest.  Cardiac: Regular rate and rhythm, soft systolic murmur, no pericardial rub.  Abdomen:  Soft, nontender, bowel sounds present.  Extremities: Chronic appearing edema with compression hose in place, distal pulses 2+.  ECG: I personally reviewed the tracing from 06/16/2014 which showed a ventricular paced rhythm.  Recent Labwork: 10/26/2015: BUN 25; Creatinine, Ser 1.27; Hemoglobin 16.8; Platelets 181; Potassium 3.7; Sodium 140  Other Studies Reviewed Today:  Echocardiogram 09/22/2010 [facility]): LVEF 50-55% with inferolateral hypokinesis, device wire present in right heart, MAC, RVSP 25-30 mmHg.  Assessment and Plan:  1. Chronic atrial fibrillation status post AV node ablation. She is symptomatically stable without any significant palpitations. Continues on Coumadin with follow-up per Dr. Manuella Ghazi.  2. St. Jude pacemaker in place, followed by Dr. Rayann Heman.  3. Type 2 diabetes mellitus, recently started on insulin per Dr. Manuella Ghazi.  4. Hyperlipidemia, previously on Pravachol. She does not recall why this medicine was stopped, may have come off of her list inadvertently. I asked her to talk with Dr. Manuella Ghazi,: Would get her LDL at least under 100 with diabetes mellitus.  Current medicines were reviewed with the patient today.  Disposition: Follow-up in 6 months.  Signed, Satira Sark, MD, [facility] 09/01/2016 2:37 PM    [facility] [place], [place], [place] [address] Phone: [phone]; Fax: [phone]

## 2016-09-01 ENCOUNTER — Encounter: Payer: Self-pay | Admitting: Cardiology

## 2016-09-01 ENCOUNTER — Ambulatory Visit (INDEPENDENT_AMBULATORY_CARE_PROVIDER_SITE_OTHER): Payer: Medicare Other | Admitting: Cardiology

## 2016-09-01 VITALS — BP 125/77 | HR 74 | Ht 64.0 in | Wt 226.2 lb

## 2016-09-01 DIAGNOSIS — E1165 Type 2 diabetes mellitus with hyperglycemia: Secondary | ICD-10-CM | POA: Diagnosis not present

## 2016-09-01 DIAGNOSIS — I4821 Permanent atrial fibrillation: Secondary | ICD-10-CM

## 2016-09-01 DIAGNOSIS — Z9889 Other specified postprocedural states: Secondary | ICD-10-CM

## 2016-09-01 DIAGNOSIS — IMO0001 Reserved for inherently not codable concepts without codable children: Secondary | ICD-10-CM

## 2016-09-01 DIAGNOSIS — Z8679 Personal history of other diseases of the circulatory system: Secondary | ICD-10-CM

## 2016-09-01 DIAGNOSIS — I482 Chronic atrial fibrillation: Secondary | ICD-10-CM | POA: Diagnosis not present

## 2016-09-01 NOTE — Patient Instructions (Signed)

## 2016-10-23 ENCOUNTER — Telehealth: Payer: Self-pay | Admitting: Cardiology

## 2016-10-23 ENCOUNTER — Ambulatory Visit (INDEPENDENT_AMBULATORY_CARE_PROVIDER_SITE_OTHER): Payer: Medicare Other | Admitting: *Deleted

## 2016-10-23 DIAGNOSIS — I442 Atrioventricular block, complete: Secondary | ICD-10-CM

## 2016-10-23 DIAGNOSIS — I482 Chronic atrial fibrillation: Secondary | ICD-10-CM

## 2016-10-23 DIAGNOSIS — I4821 Permanent atrial fibrillation: Secondary | ICD-10-CM

## 2016-10-23 NOTE — Telephone Encounter (Signed)
LMOVM reminding pt to send remote transmission.   

## 2016-10-24 LAB — CUP PACEART INCLINIC DEVICE CHECK
Implantable Lead Implant Date: 20070829
Implantable Lead Location: 753859
Implantable Pulse Generator Implant Date: 20170418
Lead Channel Impedance Value: 580 Ohm
MDC IDC LEAD IMPLANT DT: 20070829
MDC IDC LEAD LOCATION: 753860
MDC IDC PG SERIAL: 7891166
MDC IDC SESS DTM: 20180417131915
MDC IDC STAT BRADY RV PERCENT PACED: 99 %

## 2016-10-24 NOTE — Progress Notes (Signed)
Remote pacemaker transmission.   

## 2016-10-25 ENCOUNTER — Encounter: Payer: Self-pay | Admitting: Cardiology

## 2017-01-12 ENCOUNTER — Encounter: Payer: Self-pay | Admitting: Internal Medicine

## 2017-01-12 ENCOUNTER — Ambulatory Visit (INDEPENDENT_AMBULATORY_CARE_PROVIDER_SITE_OTHER): Payer: Medicare Other | Admitting: Internal Medicine

## 2017-01-12 VITALS — BP 116/72 | HR 94 | Ht 64.0 in | Wt 236.0 lb

## 2017-01-12 DIAGNOSIS — I482 Chronic atrial fibrillation: Secondary | ICD-10-CM | POA: Diagnosis not present

## 2017-01-12 DIAGNOSIS — I10 Essential (primary) hypertension: Secondary | ICD-10-CM | POA: Diagnosis not present

## 2017-01-12 DIAGNOSIS — I4821 Permanent atrial fibrillation: Secondary | ICD-10-CM

## 2017-01-12 DIAGNOSIS — Z9889 Other specified postprocedural states: Secondary | ICD-10-CM

## 2017-01-12 DIAGNOSIS — I442 Atrioventricular block, complete: Secondary | ICD-10-CM | POA: Diagnosis not present

## 2017-01-12 NOTE — Patient Instructions (Signed)
Medication Instructions:  Continue all current medications.  Labwork: none  Testing/Procedures: none  Follow-Up: Your physician wants you to follow up in:  1 year.  You will receive a reminder letter in the mail one-two months in advance.  If you don't receive a letter, please call our office to schedule the follow up appointment.  (ALLRED)  Any Other Special Instructions Will Be Listed Below (If Applicable). Remote monitoring is used to monitor your Pacemaker of ICD from home. This monitoring reduces the number of office visits required to check your device to one time per year. It allows [place] to keep an eye on the functioning of your device to ensure it is working properly. You are scheduled for a device check from home on 01-22-2017. You may send your transmission at any time that day. If you have a wireless device, the transmission will be sent automatically. After your physician reviews your transmission, you will receive a postcard with your next transmission date.  If you need a refill on your cardiac medications before your next appointment, please call your pharmacy.

## 2017-01-12 NOTE — Progress Notes (Signed)
PCP: Monico Blitz, MD Primary Cardiologist:  [name] [name] is a 83 y.o. female who presents today for routine electrophysiology followup.  Since last being seen in our clinic, the patient reports doing reasonably well.  She is having a lot of trouble with her knees.  Her primary concern is with arthritis.  She has SOB with moderate activity.  Today, she denies symptoms of palpitations, chest pain, lower extremity edema, dizziness, presyncope, or syncope.  The patient is otherwise without complaint today.   Past Medical History:  Diagnosis Date  . Breast cancer ([place])    Bilateral s/p mastectomies  . Essential hypertension, benign   . Hypothyroidism   . Nonischemic cardiomyopathy (HCC)    Question tachycardia mediated  . OSA (obstructive sleep apnea)   . Permanent atrial fibrillation (HCC)    Status post atrioventricular nodal ablation/St. Jude dual  chamber pacemaker implantation., chronic coumadin therapy followed by [name]. Manuella Ghazi  . Renal insufficiency   . Type 2 diabetes mellitus ([place])    Past Surgical History:  Procedure Laterality Date  . EP IMPLANTABLE DEVICE N/A 10/26/2015   St Jude Assurity pacemaker generator change by [name] Rayann Heman  . PACEMAKER PLACEMENT     St. Jude - follows with [name]. Rayann Heman    ROS- all systems are reviewed and negative except as per HPI above  Current Outpatient Prescriptions  Medication Sig Dispense Refill  . allopurinol (ZYLOPRIM) 100 MG tablet Take 100 mg by mouth daily.    [name] ALPRAZolam (XANAX) 0.5 MG tablet Take 0.5 mg by mouth at bedtime.     . cetirizine (ZYRTEC) 10 MG tablet Take 10 mg by mouth daily.      . Cholecalciferol (VITAMIN D3) 5000 units CAPS Take 1 capsule by mouth daily.    [name] glimepiride (AMARYL) 4 MG tablet Take 4 mg by mouth 2 (two) times daily.      . Insulin Glargine (TOUJEO MAX SOLOSTAR) 300 UNIT/ML SOPN Inject into the skin.    [name] levothyroxine (SYNTHROID, LEVOTHROID) 88 MCG tablet Take 88 mcg by mouth daily before  breakfast.    . losartan-hydrochlorothiazide (HYZAAR) 50-12.5 MG tablet Take 1 tablet by mouth daily.    . metFORMIN (GLUCOPHAGE) 500 MG tablet Take 500 mg by mouth every evening.    . Omega-3 Fatty Acids (FISH OIL ULTRA) 1400 MG CAPS Take by mouth.    . warfarin (COUMADIN) 5 MG tablet Take 5 mg by mouth daily at 6 PM. Managed by [name]. Manuella Ghazi,  2.5 mg on Mon, Wed and Fri, 5 mg all other days     No current facility-administered medications for this visit.     Physical Exam: Vitals:   01/12/17 1157  BP: 116/72  Pulse: 94  SpO2: (!) 70%  Weight: 236 lb (107 kg)  Height: 5\' 4"  (1.626 m)    GEN- The patient is well appearing, alert and oriented x 3 today.   Head- normocephalic, atraumatic Eyes-  Sclera clear, conjunctiva pink Ears- hearing intact Oropharynx- clear Lungs- Clear to ausculation bilaterally, normal work of breathing Chest- pacemaker pocket is well healed Heart- Regular rate and rhythm (paced) GI- soft, NT, ND, + BS Extremities- no clubbing, cyanosis, or edema  Pacemaker interrogation- reviewed in detail today,  See PACEART report  Assessment and Plan:  1. Complete heart block Normal pacemaker function See Pace Art report No changes today  2. Permanent atrial fibrillation On coumadin (Chads2vasc score is 5) S/p AV nodal ablation by [name] Caryl Comes years ago  3. HTN Stable No change required today  Remote monitoring with merlin Return to see me in 12 months She will see [name] Domenic Polite today as scheduled  [name] Grayer MD, [name] [name] 01/12/2017 12:24 PM

## 2017-01-17 LAB — CUP PACEART INCLINIC DEVICE CHECK
Battery Remaining Longevity: 106 mo
Brady Statistic RV Percent Paced: 99.87 %
Date Time Interrogation Session: 20180706160418
Implantable Lead Implant Date: 20070829
Implantable Lead Location: 753859
Implantable Lead Location: 753860
Lead Channel Pacing Threshold Amplitude: 1 V
Lead Channel Setting Pacing Amplitude: 2.5 V
Lead Channel Setting Pacing Pulse Width: 0.5 ms
MDC IDC LEAD IMPLANT DT: 20070829
MDC IDC MSMT BATTERY VOLTAGE: 3.02 V
MDC IDC MSMT LEADCHNL RV IMPEDANCE VALUE: 600 Ohm
MDC IDC MSMT LEADCHNL RV PACING THRESHOLD AMPLITUDE: 1 V
MDC IDC MSMT LEADCHNL RV PACING THRESHOLD PULSEWIDTH: 0.5 ms
MDC IDC MSMT LEADCHNL RV PACING THRESHOLD PULSEWIDTH: 0.5 ms
MDC IDC PG IMPLANT DT: 20170418
MDC IDC PG SERIAL: 7891166
MDC IDC SET LEADCHNL RV SENSING SENSITIVITY: 4 mV
Pulse Gen Model: 1240

## 2017-01-22 ENCOUNTER — Telehealth: Payer: Self-pay | Admitting: Cardiology

## 2017-01-22 ENCOUNTER — Ambulatory Visit (INDEPENDENT_AMBULATORY_CARE_PROVIDER_SITE_OTHER): Payer: Medicare Other | Admitting: *Deleted

## 2017-01-22 DIAGNOSIS — I442 Atrioventricular block, complete: Secondary | ICD-10-CM | POA: Diagnosis not present

## 2017-01-22 NOTE — Progress Notes (Signed)
Remote pacemaker transmission.   

## 2017-01-22 NOTE — Telephone Encounter (Signed)
Spoke with pt and reminded pt of remote transmission that is due today. Pt verbalized understanding.   

## 2017-01-24 ENCOUNTER — Encounter: Payer: Self-pay | Admitting: Cardiology

## 2017-01-24 LAB — CUP PACEART REMOTE DEVICE CHECK
Battery Voltage: 3.01 V
Brady Statistic RV Percent Paced: 99 %
Date Time Interrogation Session: 20180716150327
Implantable Lead Location: 753859
Implantable Lead Location: 753860
Lead Channel Impedance Value: 600 Ohm
Lead Channel Sensing Intrinsic Amplitude: 12 mV
Lead Channel Setting Pacing Amplitude: 2.5 V
Lead Channel Setting Pacing Pulse Width: 0.5 ms
MDC IDC LEAD IMPLANT DT: 20070829
MDC IDC LEAD IMPLANT DT: 20070829
MDC IDC MSMT BATTERY REMAINING LONGEVITY: 111 mo
MDC IDC MSMT BATTERY REMAINING PERCENTAGE: 95.5 %
MDC IDC MSMT LEADCHNL RV PACING THRESHOLD AMPLITUDE: 1 V
MDC IDC MSMT LEADCHNL RV PACING THRESHOLD PULSEWIDTH: 0.5 ms
MDC IDC PG IMPLANT DT: 20170418
MDC IDC SET LEADCHNL RV SENSING SENSITIVITY: 4 mV
Pulse Gen Model: 1240
Pulse Gen Serial Number: 7891166

## 2017-02-22 NOTE — Progress Notes (Signed)
Cardiology Office Note  Date: 02/23/2017   ID: [name] [name], DOB 06/12/1929, MRN [id number]  PCP: Monico Blitz, MD  Primary Cardiologist: Rozann Lesches, MD   Chief Complaint  Patient presents with  . Atrial Fibrillation    History of Present Illness: [name] [name] is an 83 y.o. female last seen in February. She presents today with her son for a follow-up visit. Reports no palpitations or chest pain. She is in the process of getting a Life alert system. Uses a walker, denies any recent falls. She is also undergoing adjustments in insulin with Dr. Manuella Ghazi.  She continues to follow in the device clinic with Dr. Rayann Heman, [place] pacemaker in place. She was most recently seen in July.  She continues on Coumadin with follow-up per Dr. Manuella Ghazi. She denies any bleeding problems.  I personally reviewed her ECG today which shows a ventricular paced rhythm with underlying atrial fibrillation.  Past Medical History:  Diagnosis Date  . Breast cancer ([place])    Bilateral s/p mastectomies  . Essential hypertension, benign   . Hypothyroidism   . Nonischemic cardiomyopathy (HCC)    Question tachycardia mediated  . OSA (obstructive sleep apnea)   . Permanent atrial fibrillation (HCC)    Status post atrioventricular nodal ablation/St. Jude dual  chamber pacemaker implantation., chronic coumadin therapy followed by Dr. Manuella Ghazi  . Renal insufficiency   . Type 2 diabetes mellitus ([place])     Past Surgical History:  Procedure Laterality Date  . EP IMPLANTABLE DEVICE N/A 10/26/2015   St Jude Assurity pacemaker generator change by Dr Rayann Heman  . PACEMAKER PLACEMENT     St. Jude - follows with Dr. Rayann Heman    Current Outpatient Prescriptions  Medication Sig Dispense Refill  . allopurinol (ZYLOPRIM) 100 MG tablet Take 100 mg by mouth daily.    [name] ALPRAZolam (XANAX) 0.5 MG tablet Take 0.5 mg by mouth at bedtime.     . cetirizine (ZYRTEC) 10 MG tablet Take 10 mg by mouth daily.      .  Cholecalciferol (VITAMIN D3) 5000 units CAPS Take 1 capsule by mouth daily.    [name] glimepiride (AMARYL) 4 MG tablet Take 4 mg by mouth 2 (two) times daily.      . insulin lispro (HUMALOG KWIKPEN) 100 UNIT/ML KiwkPen Inject 5 Units into the skin 2 (two) times daily.    [name] levothyroxine (SYNTHROID, LEVOTHROID) 88 MCG tablet Take 88 mcg by mouth daily before breakfast.    . losartan-hydrochlorothiazide (HYZAAR) 50-12.5 MG tablet Take 1 tablet by mouth daily.    . metFORMIN (GLUCOPHAGE) 500 MG tablet Take 500 mg by mouth every evening.    . NON FORMULARY FREE STYLE LIBERAL    . Omega-3 Fatty Acids (FISH OIL ULTRA) 1400 MG CAPS Take by mouth.    . warfarin (COUMADIN) 5 MG tablet Take 5 mg by mouth daily at 6 PM. Managed by Dr. Manuella Ghazi,  2.5 mg on Mon, Wed and Fri, 5 mg all other days     No current facility-administered medications for this visit.    Allergies:  Codeine and Nitrofurantoin   Social History: The patient  reports that she has never smoked. She has never used smokeless tobacco. She reports that she does not drink alcohol or use drugs.   ROS:  Please see the history of present illness. Otherwise, complete review of systems is positive for chronic arthritic pains.  All other systems are reviewed and negative.   Physical Exam: VS:  BP  122/66   Pulse 73   Ht _0  (1.626 m)   Wt 233 lb (105.7 kg)   SpO2 96%   BMI 39.99 kg/m , BMI Body mass index is 39.99 kg/m.  Wt Readings from Last 3 Encounters:  02/23/17 233 lb (105.7 kg)  01/12/17 236 lb (107 kg)  09/01/16 226 lb 3.2 oz (102.6 kg)    Obese elderly woman, appears comfortable at rest. Uses a walker. HEENT: Conjunctiva and lids normal, oropharynx clear.  Neck: Supple, no elevated JVP or carotid bruits, no thyromegaly.  Lungs: Clear to auscultation, nonlabored breathing at rest.  Cardiac: Regular rate and rhythm, soft systolic murmur, no pericardial rub.  Abdomen: Soft, nontender, bowel sounds present.  Extremities: Chronic  appearing edema with compression hose in place, distal pulses 2+. Skin: Warm and dry. Musculoskeletal: No kyphosis. Neuropsychiatric: Alert and oriented 3, affect appropriate.  ECG: I personally reviewed the tracing from 06/16/2014 which showed a ventricular paced rhythm.  Recent Labwork:  10/26/2015: BUN 25; Creatinine, Ser 1.27; Hemoglobin 16.8; Platelets 181; Potassium 3.7; Sodium 140   Other Studies Reviewed Today:  Echocardiogram 09/22/2010 [facility]): LVEF 50-55% with inferolateral hypokinesis, device wire present in right heart, MAC, RVSP 25-30 mmHg.  Assessment and Plan:  1. Chronic atrial fibrillation status post AV node ablation. She disease on Coumadin with follow-up per Dr. Manuella Ghazi.  2. St. Jude pacemaker in place following AV node ablation. She follows with Dr. Rayann Heman in the device clinic.  3. Type 2 diabetes mellitus, on insulin with adjustments per Dr. Manuella Ghazi.  4. Essential hypertension, on Hyzaar. Blood pressure well controlled today.  Current medicines were reviewed with the patient today.   Orders Placed This Encounter  Procedures  . EKG 12-Lead    Disposition: Follow-up in 6 months.  Signed, Satira Sark, MD, [facility] 02/23/2017 1:12 PM    [place] at [place], [place], [place] [address] Phone: [phone]; Fax: [phone]

## 2017-02-23 ENCOUNTER — Encounter: Payer: Self-pay | Admitting: Cardiology

## 2017-02-23 ENCOUNTER — Ambulatory Visit (INDEPENDENT_AMBULATORY_CARE_PROVIDER_SITE_OTHER): Payer: Medicare Other | Admitting: Cardiology

## 2017-02-23 VITALS — BP 122/66 | HR 73 | Ht 64.0 in | Wt 233.0 lb

## 2017-02-23 DIAGNOSIS — I10 Essential (primary) hypertension: Secondary | ICD-10-CM

## 2017-02-23 DIAGNOSIS — Z9889 Other specified postprocedural states: Secondary | ICD-10-CM

## 2017-02-23 DIAGNOSIS — I482 Chronic atrial fibrillation: Secondary | ICD-10-CM

## 2017-02-23 DIAGNOSIS — E1165 Type 2 diabetes mellitus with hyperglycemia: Secondary | ICD-10-CM

## 2017-02-23 DIAGNOSIS — IMO0001 Reserved for inherently not codable concepts without codable children: Secondary | ICD-10-CM

## 2017-02-23 DIAGNOSIS — I4821 Permanent atrial fibrillation: Secondary | ICD-10-CM

## 2017-02-23 NOTE — Patient Instructions (Signed)

## 2017-04-23 ENCOUNTER — Ambulatory Visit (INDEPENDENT_AMBULATORY_CARE_PROVIDER_SITE_OTHER): Payer: Medicare Other | Admitting: *Deleted

## 2017-04-23 DIAGNOSIS — I442 Atrioventricular block, complete: Secondary | ICD-10-CM | POA: Diagnosis not present

## 2017-04-24 NOTE — Progress Notes (Signed)
Remote pacemaker transmission.   

## 2017-04-26 LAB — CUP PACEART REMOTE DEVICE CHECK
Battery Remaining Longevity: 116 mo
Battery Remaining Percentage: 95.5 %
Battery Voltage: 3.01 V
Brady Statistic RV Percent Paced: 99 %
Date Time Interrogation Session: 20181015095233
Implantable Lead Implant Date: 20070829
Implantable Lead Location: 753860
Lead Channel Setting Pacing Amplitude: 2.5 V
Lead Channel Setting Pacing Pulse Width: 0.5 ms
MDC IDC LEAD IMPLANT DT: 20070829
MDC IDC LEAD LOCATION: 753859
MDC IDC MSMT LEADCHNL RV IMPEDANCE VALUE: 580 Ohm
MDC IDC MSMT LEADCHNL RV PACING THRESHOLD AMPLITUDE: 1 V
MDC IDC MSMT LEADCHNL RV PACING THRESHOLD PULSEWIDTH: 0.5 ms
MDC IDC MSMT LEADCHNL RV SENSING INTR AMPL: 5.7 mV
MDC IDC PG IMPLANT DT: 20170418
MDC IDC PG SERIAL: 7891166
MDC IDC SET LEADCHNL RV SENSING SENSITIVITY: 4 mV
Pulse Gen Model: 1240

## 2017-04-27 ENCOUNTER — Encounter: Payer: Self-pay | Admitting: Cardiology

## 2017-07-23 ENCOUNTER — Ambulatory Visit (INDEPENDENT_AMBULATORY_CARE_PROVIDER_SITE_OTHER): Payer: Medicare Other | Admitting: *Deleted

## 2017-07-23 DIAGNOSIS — I442 Atrioventricular block, complete: Secondary | ICD-10-CM

## 2017-07-25 LAB — CUP PACEART REMOTE DEVICE CHECK
Date Time Interrogation Session: 20190114083232
Implantable Lead Implant Date: 20070829
Implantable Lead Location: 753859
Implantable Pulse Generator Implant Date: 20170418
Lead Channel Impedance Value: 580 Ohm
Lead Channel Pacing Threshold Amplitude: 1 V
Lead Channel Pacing Threshold Pulse Width: 0.5 ms
Lead Channel Sensing Intrinsic Amplitude: 5.7 mV
Lead Channel Setting Pacing Pulse Width: 0.5 ms
MDC IDC LEAD IMPLANT DT: 20070829
MDC IDC LEAD LOCATION: 753860
MDC IDC MSMT BATTERY REMAINING LONGEVITY: 116 mo
MDC IDC MSMT BATTERY REMAINING PERCENTAGE: 95.5 %
MDC IDC MSMT BATTERY VOLTAGE: 3.01 V
MDC IDC SET LEADCHNL RV PACING AMPLITUDE: 2.5 V
MDC IDC SET LEADCHNL RV SENSING SENSITIVITY: 4 mV
MDC IDC STAT BRADY RV PERCENT PACED: 99 %
Pulse Gen Serial Number: 7891166

## 2017-07-25 NOTE — Progress Notes (Signed)
Remote pacemaker transmission.   

## 2017-07-27 ENCOUNTER — Encounter: Payer: Self-pay | Admitting: Cardiology

## 2017-08-23 NOTE — Progress Notes (Signed)
Cardiology Office Note  Date: 08/24/2017   ID: [name] [name], DOB 06-21-1929, MRN [id number]  PCP: Monico Blitz, MD  Primary Cardiologist: Rozann Lesches, MD   Chief Complaint  Patient presents with  . Atrial Fibrillation    History of Present Illness: [name] [name] is an 84 y.o. female last seen in August 2018.  She presents for a routine follow-up visit today.  She tells me that she has not had any major changes in health.  Continues to be unsteady on her feet with history of falls and uses a walker at home.  She does not report any angina symptoms or palpitations.  Has occasional dizziness but no syncope.  She continues to follow with Dr. Rayann Heman in the device clinic, [place] pacemaker in place.  Most recent interrogation showed normal device function.  She continues to follow with Dr. Manuella Ghazi on Coumadin.  She does not report any bleeding problems.  I went over her current medications which are outlined below.  Past Medical History:  Diagnosis Date  . Breast cancer ([place])    Bilateral s/p mastectomies  . Essential hypertension, benign   . Hypothyroidism   . Nonischemic cardiomyopathy (HCC)    Question tachycardia mediated  . OSA (obstructive sleep apnea)   . Permanent atrial fibrillation (HCC)    Status post atrioventricular nodal ablation/St. Jude dual  chamber pacemaker implantation., chronic coumadin therapy followed by Dr. Manuella Ghazi  . Renal insufficiency   . Type 2 diabetes mellitus ([place])     Past Surgical History:  Procedure Laterality Date  . EP IMPLANTABLE DEVICE N/A 10/26/2015   St Jude Assurity pacemaker generator change by Dr Rayann Heman  . PACEMAKER PLACEMENT     St. Jude - follows with Dr. Rayann Heman    Current Outpatient Medications  Medication Sig Dispense Refill  . allopurinol (ZYLOPRIM) 100 MG tablet Take 100 mg by mouth daily.    [name] ALPRAZolam (XANAX) 0.5 MG tablet Take 0.5 mg by mouth at bedtime.     . cetirizine (ZYRTEC) 10 MG tablet Take 10 mg by  mouth daily.      . Cholecalciferol (VITAMIN D3) 5000 units CAPS Take 1 capsule by mouth daily.    . insulin lispro (HUMALOG KWIKPEN) 100 UNIT/ML KiwkPen Inject 5 Units into the skin 2 (two) times daily.    [name] levothyroxine (SYNTHROID, LEVOTHROID) 88 MCG tablet Take 88 mcg by mouth daily before breakfast.    . losartan-hydrochlorothiazide (HYZAAR) 50-12.5 MG tablet Take 1 tablet by mouth daily.    . metFORMIN (GLUCOPHAGE) 500 MG tablet Take 500 mg by mouth every evening.    . NON FORMULARY FREE STYLE LIBERAL    . Omega-3 Fatty Acids (FISH OIL ULTRA) 1400 MG CAPS Take by mouth.    . warfarin (COUMADIN) 5 MG tablet Take 5 mg by mouth daily at 6 PM. Managed by Dr. Manuella Ghazi,  2.5 mg on Mon, Wed and Fri, 5 mg all other days     No current facility-administered medications for this visit.    Allergies:  Codeine and Nitrofurantoin   Social History: The patient  reports that  has never smoked. she has never used smokeless tobacco. She reports that she does not drink alcohol or use drugs.   ROS:  Please see the history of present illness. Otherwise, complete review of systems is positive for hearing loss, arthritic pains.  All other systems are reviewed and negative.   Physical Exam: VS:  BP 136/84   Pulse 75  Ht _0  (1.626 m)   Wt 234 lb (106.1 kg)   SpO2 95%   BMI 40.17 kg/m , BMI Body mass index is 40.17 kg/m.  Wt Readings from Last 3 Encounters:  08/24/17 234 lb (106.1 kg)  02/23/17 233 lb (105.7 kg)  01/12/17 236 lb (107 kg)    General: Elderly woman, appears comfortable at rest.  Using a walker. HEENT: Conjunctiva and lids normal, oropharynx clear. Neck: Supple, no elevated JVP or carotid bruits, no thyromegaly. Lungs: Clear to auscultation, nonlabored breathing at rest. Cardiac: Regular rate and rhythm, no S3, soft systolic murmur. Abdomen: Soft, nontender, bowel sounds present. Extremities: Chronic appearing leg edema, distal pulses 2+. Skin: Warm and dry. Musculoskeletal: No  kyphosis. Neuropsychiatric: Alert and oriented x3, affect grossly appropriate.  ECG: I personally reviewed the tracing from 02/23/2017 which showed a ventricular paced rhythm.  Recent Labwork:  10/26/2015: BUN 25; Creatinine, Ser 1.27; Hemoglobin 16.8; Platelets 181; Potassium 3.7; Sodium 140  Other Studies Reviewed Today:  Echocardiogram 09/22/2010 [facility]): LVEF 50-55% with inferolateral hypokinesis, device wire present in right heart, MAC, RVSP 25-30 mmHg.  Assessment and Plan:  1.  Chronic atrial fibrillation status post AV node ablation.  She does not report palpitations.  Continues on Coumadin with follow-up per PCP.  2.  St. Jude pacemaker in place, followed by Dr. Rayann Heman.  3.  Essential hypertension, continues on Hyzaar.  Blood pressure control is adequate today.  Current medicines were reviewed with the patient today.  Disposition: Follow-up in 6 months.  Signed, Satira Sark, MD, [facility] 08/24/2017 3:47 PM    [place] at [place], [place], [place] [address] Phone: [phone]; Fax: [phone]

## 2017-08-24 ENCOUNTER — Encounter: Payer: Self-pay | Admitting: Cardiology

## 2017-08-24 ENCOUNTER — Ambulatory Visit (INDEPENDENT_AMBULATORY_CARE_PROVIDER_SITE_OTHER): Payer: Medicare Other | Admitting: Cardiology

## 2017-08-24 VITALS — BP 136/84 | HR 75 | Ht 64.0 in | Wt 234.0 lb

## 2017-08-24 DIAGNOSIS — I482 Chronic atrial fibrillation: Secondary | ICD-10-CM

## 2017-08-24 DIAGNOSIS — Z9889 Other specified postprocedural states: Secondary | ICD-10-CM

## 2017-08-24 DIAGNOSIS — I10 Essential (primary) hypertension: Secondary | ICD-10-CM | POA: Diagnosis not present

## 2017-08-24 DIAGNOSIS — I4821 Permanent atrial fibrillation: Secondary | ICD-10-CM

## 2017-08-24 NOTE — Patient Instructions (Signed)

## 2017-10-22 ENCOUNTER — Ambulatory Visit (INDEPENDENT_AMBULATORY_CARE_PROVIDER_SITE_OTHER): Payer: Medicare Other | Admitting: *Deleted

## 2017-10-22 DIAGNOSIS — I442 Atrioventricular block, complete: Secondary | ICD-10-CM | POA: Diagnosis not present

## 2017-10-23 NOTE — Progress Notes (Signed)
Remote pacemaker transmission.   

## 2017-10-24 ENCOUNTER — Encounter: Payer: Self-pay | Admitting: Cardiology

## 2017-10-29 LAB — CUP PACEART REMOTE DEVICE CHECK
Battery Remaining Percentage: 95.5 %
Battery Voltage: 3.01 V
Brady Statistic RV Percent Paced: 99 %
Implantable Lead Implant Date: 20070829
Implantable Lead Implant Date: 20070829
Implantable Lead Location: 753860
Implantable Pulse Generator Implant Date: 20170418
Lead Channel Impedance Value: 600 Ohm
Lead Channel Pacing Threshold Amplitude: 1 V
Lead Channel Pacing Threshold Pulse Width: 0.5 ms
Lead Channel Setting Pacing Amplitude: 2.5 V
Lead Channel Setting Pacing Pulse Width: 0.5 ms
MDC IDC LEAD LOCATION: 753859
MDC IDC MSMT BATTERY REMAINING LONGEVITY: 118 mo
MDC IDC MSMT LEADCHNL RV SENSING INTR AMPL: 5.7 mV
MDC IDC SESS DTM: 20190415060015
MDC IDC SET LEADCHNL RV SENSING SENSITIVITY: 4 mV
Pulse Gen Serial Number: 7891166

## 2018-01-11 ENCOUNTER — Encounter: Payer: Medicare Other | Admitting: Internal Medicine

## 2018-01-21 ENCOUNTER — Ambulatory Visit (INDEPENDENT_AMBULATORY_CARE_PROVIDER_SITE_OTHER): Payer: Medicare Other | Admitting: *Deleted

## 2018-01-21 DIAGNOSIS — I442 Atrioventricular block, complete: Secondary | ICD-10-CM | POA: Diagnosis not present

## 2018-01-22 NOTE — Progress Notes (Signed)
Remote pacemaker transmission.   

## 2018-01-23 ENCOUNTER — Encounter: Payer: Self-pay | Admitting: Cardiology

## 2018-01-23 LAB — CUP PACEART REMOTE DEVICE CHECK
Battery Remaining Longevity: 117 mo
Battery Remaining Percentage: 95.5 %
Battery Voltage: 3.01 V
Date Time Interrogation Session: 20190715060025
Implantable Lead Location: 753859
Implantable Lead Location: 753860
Lead Channel Impedance Value: 590 Ohm
Lead Channel Setting Pacing Pulse Width: 0.5 ms
Lead Channel Setting Sensing Sensitivity: 4 mV
MDC IDC LEAD IMPLANT DT: 20070829
MDC IDC LEAD IMPLANT DT: 20070829
MDC IDC MSMT LEADCHNL RV PACING THRESHOLD AMPLITUDE: 1 V
MDC IDC MSMT LEADCHNL RV PACING THRESHOLD PULSEWIDTH: 0.5 ms
MDC IDC MSMT LEADCHNL RV SENSING INTR AMPL: 5.7 mV
MDC IDC PG IMPLANT DT: 20170418
MDC IDC SET LEADCHNL RV PACING AMPLITUDE: 2.5 V
MDC IDC STAT BRADY RV PERCENT PACED: 99 %
Pulse Gen Model: 1240
Pulse Gen Serial Number: 7891166

## 2018-03-15 ENCOUNTER — Ambulatory Visit (INDEPENDENT_AMBULATORY_CARE_PROVIDER_SITE_OTHER): Payer: Medicare Other | Admitting: Internal Medicine

## 2018-03-15 ENCOUNTER — Encounter: Payer: Self-pay | Admitting: Internal Medicine

## 2018-03-15 VITALS — BP 118/70 | HR 76 | Ht 64.0 in | Wt 234.4 lb

## 2018-03-15 DIAGNOSIS — I4821 Permanent atrial fibrillation: Secondary | ICD-10-CM

## 2018-03-15 DIAGNOSIS — I482 Chronic atrial fibrillation: Secondary | ICD-10-CM | POA: Diagnosis not present

## 2018-03-15 DIAGNOSIS — I10 Essential (primary) hypertension: Secondary | ICD-10-CM | POA: Diagnosis not present

## 2018-03-15 DIAGNOSIS — I442 Atrioventricular block, complete: Secondary | ICD-10-CM

## 2018-03-15 NOTE — Patient Instructions (Signed)
Medication Instructions:  Continue all current medications.  Labwork: none  Testing/Procedures: none  Follow-Up: Your physician wants you to follow up in:  1 year.  You will receive a reminder letter in the mail one-two months in advance.  If you don't receive a letter, please call our office to schedule the follow up appointment   Any Other Special Instructions Will Be Listed Below (If Applicable). Remote monitoring is used to monitor your Pacemaker of ICD from home. This monitoring reduces the number of office visits required to check your device to one time per year. It allows [place] to keep an eye on the functioning of your device to ensure it is working properly. You are scheduled for a device check from home on 04-22-2018. You may send your transmission at any time that day. If you have a wireless device, the transmission will be sent automatically. After your physician reviews your transmission, you will receive a postcard with your next transmission date.  If you need a refill on your cardiac medications before your next appointment, please call your pharmacy.

## 2018-03-15 NOTE — Progress Notes (Signed)
PCP: Monico Blitz, MD Primary Cardiologist: Dr Domenic Polite Primary EP:  Dr Rayann Heman  [name] [name] is a 84 y.o. female who presents today for routine electrophysiology followup.  Since last being seen in our clinic, the patient reports doing very well.  Stable SOB.   + arthritis.  Today, she denies symptoms of palpitations, chest pain,  lower extremity edema, dizziness, presyncope, or syncope.  The patient is otherwise without complaint today.   Past Medical History:  Diagnosis Date  . Breast cancer ([place])    Bilateral s/p mastectomies  . Essential hypertension, benign   . Hypothyroidism   . Nonischemic cardiomyopathy (HCC)    Question tachycardia mediated  . OSA (obstructive sleep apnea)   . Permanent atrial fibrillation (HCC)    Status post atrioventricular nodal ablation/St. Jude dual  chamber pacemaker implantation., chronic coumadin therapy followed by Dr. Manuella Ghazi  . Renal insufficiency   . Type 2 diabetes mellitus ([place])    Past Surgical History:  Procedure Laterality Date  . EP IMPLANTABLE DEVICE N/A 10/26/2015   St Jude Assurity pacemaker generator change by Dr Rayann Heman  . PACEMAKER PLACEMENT     St. Jude - follows with Dr. Rayann Heman    ROS- all systems are reviewed and negative except as per HPI above  Current Outpatient Medications  Medication Sig Dispense Refill  . allopurinol (ZYLOPRIM) 100 MG tablet Take 100 mg by mouth daily.    [name] ALPRAZolam (XANAX) 0.5 MG tablet Take 0.5 mg by mouth at bedtime.     . cefUROXime (CEFTIN) 500 MG tablet Take 1 tablet by mouth 2 (two) times daily.  0  . Cholecalciferol (VITAMIN D3) 5000 units CAPS Take 1 capsule by mouth daily.    [name] glimepiride (AMARYL) 4 MG tablet Take 1 tablet by mouth daily.    . insulin lispro (HUMALOG KWIKPEN) 100 UNIT/ML KiwkPen Inject 10 Units into the skin 2 (two) times daily.     [name] levothyroxine (SYNTHROID, LEVOTHROID) 88 MCG tablet Take 88 mcg by mouth daily before breakfast.    . losartan-hydrochlorothiazide  (HYZAAR) 50-12.5 MG tablet Take 1 tablet by mouth daily.    . metFORMIN (GLUCOPHAGE) 500 MG tablet Take 500 mg by mouth every evening.    . NON FORMULARY FREE STYLE LIBERAL    . Omega-3 Fatty Acids (FISH OIL ULTRA) 1400 MG CAPS Take by mouth.    Nelva Nay SOLOSTAR 300 UNIT/ML SOPN Inject 20 Units as directed every morning.  1  . warfarin (COUMADIN) 5 MG tablet Take 5 mg by mouth daily at 6 PM. Managed by Dr. Manuella Ghazi,  2.5 mg on Mon, Wed and Fri, 5 mg all other days     No current facility-administered medications for this visit.     Physical Exam: Vitals:   03/15/18 1038  BP: 118/70  Pulse: 76  SpO2: 96%  Weight: 234 lb 6.4 oz (106.3 kg)  Height: 5\' 4"  (1.626 m)    GEN- The patient is elderly appearing, alert and oriented x 3 today.   Head- normocephalic, atraumatic Eyes-  Sclera clear, conjunctiva pink Ears- hearing intact Oropharynx- clear Lungs- Clear to ausculation bilaterally, normal work of breathing Chest- pacemaker pocket is well healed Heart- Regular rate and rhythm, no murmurs, rubs or gallops, PMI not laterally displaced GI- soft, NT, ND, + BS Extremities- no clubbing, cyanosis, or edema  Pacemaker interrogation- reviewed in detail today,  See PACEART report   Assessment and Plan:  1. Symptomatic complete heart block Normal pacemaker function See  Pace Art report No changes today  2. Permanent afib chads2vasc score is 5 S/p AV nodal  Ablation by Dr Caryl Comes  3. HTN Stable No change required today  Merlin Return in a year  Thompson Grayer MD, [facility] 03/15/2018 11:15 AM

## 2018-03-17 LAB — CUP PACEART INCLINIC DEVICE CHECK
Battery Remaining Longevity: 111 mo
Implantable Lead Implant Date: 20070829
Implantable Lead Location: 753859
Implantable Lead Location: 753860
Implantable Pulse Generator Implant Date: 20170418
Lead Channel Pacing Threshold Amplitude: 1 V
Lead Channel Pacing Threshold Pulse Width: 0.5 ms
Lead Channel Pacing Threshold Pulse Width: 0.5 ms
Lead Channel Setting Sensing Sensitivity: 4 mV
MDC IDC LEAD IMPLANT DT: 20070829
MDC IDC MSMT BATTERY VOLTAGE: 3.02 V
MDC IDC MSMT LEADCHNL RV IMPEDANCE VALUE: 562.5 Ohm
MDC IDC MSMT LEADCHNL RV PACING THRESHOLD AMPLITUDE: 1 V
MDC IDC MSMT LEADCHNL RV SENSING INTR AMPL: 5.7 mV
MDC IDC PG SERIAL: 7891166
MDC IDC SESS DTM: 20190906145924
MDC IDC SET LEADCHNL RV PACING AMPLITUDE: 2.5 V
MDC IDC SET LEADCHNL RV PACING PULSEWIDTH: 0.5 ms
MDC IDC STAT BRADY RV PERCENT PACED: 99.81 %

## 2018-04-22 ENCOUNTER — Ambulatory Visit (INDEPENDENT_AMBULATORY_CARE_PROVIDER_SITE_OTHER): Payer: Medicare Other | Admitting: *Deleted

## 2018-04-22 DIAGNOSIS — I442 Atrioventricular block, complete: Secondary | ICD-10-CM

## 2018-04-23 ENCOUNTER — Ambulatory Visit: Payer: Medicare Other | Admitting: Cardiology

## 2018-04-23 NOTE — Progress Notes (Signed)
Remote pacemaker transmission.   

## 2018-04-25 ENCOUNTER — Encounter: Payer: Self-pay | Admitting: Cardiology

## 2018-06-20 NOTE — Progress Notes (Signed)
Cardiology Office Note  Date: 06/21/2018   ID: [name] [name], DOB [birth year], MRN [id number]  PCP: Monico Blitz, MD  Primary Cardiologist: Rozann Lesches, MD   Chief Complaint  Patient presents with  . Atrial Fibrillation    History of Present Illness: [name] [name] is an 84 y.o. female last seen in February.  She is here with her son for a follow-up visit.  She states that she was hospitalized a few Segers ago with apparent fluid overload and also UTI.  She is now on Lasix with potassium supplements.  Reports some burning in her urine even recently and plans to see PCP today for repeat urinalysis.  No fevers or chills.  She sees Dr. Rayann Heman in the device clinic, [place] pacemaker in place.  She is status post AV node ablation as well.  She is on Coumadin with follow-up per Dr. Manuella Ghazi.  Denies any bleeding problems.  She has not had a recent follow-up echocardiogram.  Past Medical History:  Diagnosis Date  . Breast cancer ([place])    Bilateral s/p mastectomies  . Essential hypertension, benign   . Hypothyroidism   . Nonischemic cardiomyopathy (HCC)    Question tachycardia mediated  . OSA (obstructive sleep apnea)   . Permanent atrial fibrillation    Status post atrioventricular nodal ablation/St. Jude dual  chamber pacemaker implantation., chronic coumadin therapy followed by Dr. Manuella Ghazi  . Renal insufficiency   . Type 2 diabetes mellitus ([place])     Past Surgical History:  Procedure Laterality Date  . EP IMPLANTABLE DEVICE N/A 10/26/2015   St Jude Assurity pacemaker generator change by Dr Rayann Heman  . PACEMAKER PLACEMENT     St. Jude - follows with Dr. Rayann Heman    Current Outpatient Medications  Medication Sig Dispense Refill  . allopurinol (ZYLOPRIM) 100 MG tablet Take 100 mg by mouth daily.    [name] ALPRAZolam (XANAX) 0.5 MG tablet Take 0.5 mg by mouth at bedtime.     . cefUROXime (CEFTIN) 500 MG tablet Take 1 tablet by mouth 2 (two) times daily.  0  . Cholecalciferol  (VITAMIN D3) 5000 units CAPS Take 1 capsule by mouth daily.    . furosemide (LASIX) 40 MG tablet Take 40 mg by mouth daily.    [name] glimepiride (AMARYL) 4 MG tablet Take 1 tablet by mouth daily.    . insulin lispro (HUMALOG KWIKPEN) 100 UNIT/ML KiwkPen Inject 10 Units into the skin 2 (two) times daily.     [name] levothyroxine (SYNTHROID, LEVOTHROID) 88 MCG tablet Take 88 mcg by mouth daily before breakfast.    . losartan-hydrochlorothiazide (HYZAAR) 50-12.5 MG tablet Take 1 tablet by mouth daily.    . metFORMIN (GLUCOPHAGE) 500 MG tablet Take 500 mg by mouth every evening.    . NON FORMULARY FREE STYLE LIBERAL    . Omega-3 Fatty Acids (FISH OIL ULTRA) 1400 MG CAPS Take by mouth.    . potassium chloride (K-DUR,KLOR-CON) 10 MEQ tablet Take 10 mEq by mouth daily.    Nelva Nay SOLOSTAR 300 UNIT/ML SOPN Inject 20 Units as directed every morning.  1  . warfarin (COUMADIN) 5 MG tablet Take 5 mg by mouth daily at 6 PM. Managed by Dr. Manuella Ghazi,  2.5 mg on Mon, Wed and Fri, 5 mg all other days     No current facility-administered medications for this visit.    Allergies:  Codeine and Nitrofurantoin   Social History: The patient  reports that she has never smoked. She has  never used smokeless tobacco. She reports that she does not drink alcohol or use drugs.   ROS:  Please see the history of present illness. Otherwise, complete review of systems is positive for chronic fatigue, in a wheelchair today.  All other systems are reviewed and negative.   Physical Exam: VS:  BP 118/60   Pulse 74   Ht _0  (1.626 m)   Wt 242 lb (109.8 kg)   SpO2 95%   BMI 41.54 kg/m , BMI Body mass index is 41.54 kg/m.  Wt Readings from Last 3 Encounters:  06/21/18 242 lb (109.8 kg)  03/15/18 234 lb 6.4 oz (106.3 kg)  08/24/17 234 lb (106.1 kg)    General: Elderly woman, appears comfortable at rest.  In wheelchair. HEENT: Conjunctiva and lids normal, oropharynx clear. Neck: Supple, no elevated JVP or carotid bruits, no  thyromegaly. Lungs: Clear to auscultation, nonlabored breathing at rest. Cardiac: Regular rate and rhythm, no, soft systolic murmur. Abdomen: Soft, nontender, bowel sounds present. Extremities: Chronic lower leg edema, distal pulses 2+. Skin: Warm and dry. Musculoskeletal: No kyphosis. Neuropsychiatric: Alert and oriented x3, affect grossly appropriate.  ECG: I personally reviewed the tracing from 02/23/2017 which showed a ventricular paced rhythm.  Recent Labwork:  10/26/2015: BUN 25; Creatinine, Ser 1.27; Hemoglobin 16.8; Platelets 181; Potassium 3.7; Sodium 140  Other Studies Reviewed Today:  Echocardiogram 09/22/2010 [facility]): LVEF 50-55% with inferolateral hypokinesis, device wire present in right heart, MAC, RVSP 25-30 mmHg.  Assessment and Plan:  1.  Permanent atrial fibrillation status post AV node ablation with pacemaker.  She continues on Coumadin with follow-up per Dr. Manuella Ghazi, reports no bleeding problems.  She is asymptomatic.  2.  St. Jude pacemaker in place, follows with Dr. Rayann Heman.  3.  History of tachycardia mediated cardiomyopathy, LVEF 50 to 55% as of 2012.  With recent reported fluid overload and now on Lasix, plan follow-up echocardiogram for reassessment.  4.  Essential hypertension, blood pressure well controlled today.  She continues on Hyzaar.  Current medicines were reviewed with the patient today.   Orders Placed This Encounter  Procedures  . EKG 12-Lead  . ECHOCARDIOGRAM COMPLETE    Disposition: Follow-up in 6 months.  Signed, Satira Sark, MD, [facility] 06/21/2018 12:34 PM    [place] at [place], [place], [place] [address] Phone: [phone]; Fax: [phone]

## 2018-06-21 ENCOUNTER — Telehealth: Payer: Self-pay | Admitting: Cardiology

## 2018-06-21 ENCOUNTER — Ambulatory Visit (INDEPENDENT_AMBULATORY_CARE_PROVIDER_SITE_OTHER): Payer: Medicare Other | Admitting: Cardiology

## 2018-06-21 ENCOUNTER — Encounter: Payer: Self-pay | Admitting: Cardiology

## 2018-06-21 ENCOUNTER — Encounter

## 2018-06-21 VITALS — BP 118/60 | HR 74 | Ht 64.0 in | Wt 242.0 lb

## 2018-06-21 DIAGNOSIS — I10 Essential (primary) hypertension: Secondary | ICD-10-CM

## 2018-06-21 DIAGNOSIS — I4821 Permanent atrial fibrillation: Secondary | ICD-10-CM

## 2018-06-21 DIAGNOSIS — Z8679 Personal history of other diseases of the circulatory system: Secondary | ICD-10-CM

## 2018-06-21 DIAGNOSIS — Z9889 Other specified postprocedural states: Secondary | ICD-10-CM | POA: Diagnosis not present

## 2018-06-21 NOTE — Patient Instructions (Addendum)

## 2018-06-21 NOTE — Telephone Encounter (Signed)
°  Precert needed for: 2 D Echo dx: h/o cardiomyopathy  Location:  [facility]     Date: Jul 18, 2018

## 2018-06-26 ENCOUNTER — Other Ambulatory Visit: Payer: Self-pay | Admitting: Cardiology

## 2018-06-26 DIAGNOSIS — I429 Cardiomyopathy, unspecified: Secondary | ICD-10-CM

## 2018-06-26 DIAGNOSIS — I4821 Permanent atrial fibrillation: Secondary | ICD-10-CM

## 2018-06-29 LAB — CUP PACEART REMOTE DEVICE CHECK
Implantable Lead Implant Date: 20070829
Implantable Lead Location: 753860
Implantable Pulse Generator Implant Date: 20170418
MDC IDC LEAD IMPLANT DT: 20070829
MDC IDC LEAD LOCATION: 753859
MDC IDC PG SERIAL: 7891166
MDC IDC SESS DTM: 20191221111616

## 2018-07-18 ENCOUNTER — Other Ambulatory Visit: Payer: Medicare Other

## 2018-07-22 ENCOUNTER — Ambulatory Visit (INDEPENDENT_AMBULATORY_CARE_PROVIDER_SITE_OTHER): Payer: Medicare Other

## 2018-07-22 DIAGNOSIS — I442 Atrioventricular block, complete: Secondary | ICD-10-CM | POA: Diagnosis not present

## 2018-07-23 NOTE — Progress Notes (Signed)
Remote pacemaker transmission.   

## 2018-07-25 ENCOUNTER — Other Ambulatory Visit: Payer: Self-pay

## 2018-07-25 ENCOUNTER — Ambulatory Visit (INDEPENDENT_AMBULATORY_CARE_PROVIDER_SITE_OTHER): Payer: Medicare Other

## 2018-07-25 DIAGNOSIS — I4821 Permanent atrial fibrillation: Secondary | ICD-10-CM

## 2018-07-25 DIAGNOSIS — I429 Cardiomyopathy, unspecified: Secondary | ICD-10-CM | POA: Diagnosis not present

## 2018-07-26 LAB — CUP PACEART REMOTE DEVICE CHECK
Battery Voltage: 3.02 V
Brady Statistic RV Percent Paced: 99 %
Date Time Interrogation Session: 20200113070015
Implantable Lead Implant Date: 20070829
Implantable Lead Location: 753860
Lead Channel Impedance Value: 550 Ohm
Lead Channel Sensing Intrinsic Amplitude: 5.7 mV
Lead Channel Setting Pacing Amplitude: 2.5 V
Lead Channel Setting Pacing Pulse Width: 0.5 ms
MDC IDC LEAD IMPLANT DT: 20070829
MDC IDC LEAD LOCATION: 753859
MDC IDC MSMT BATTERY REMAINING LONGEVITY: 116 mo
MDC IDC MSMT BATTERY REMAINING PERCENTAGE: 95.5 %
MDC IDC MSMT LEADCHNL RV PACING THRESHOLD AMPLITUDE: 1 V
MDC IDC MSMT LEADCHNL RV PACING THRESHOLD PULSEWIDTH: 0.5 ms
MDC IDC PG IMPLANT DT: 20170418
MDC IDC SET LEADCHNL RV SENSING SENSITIVITY: 4 mV
Pulse Gen Model: 1240
Pulse Gen Serial Number: 7891166

## 2018-07-31 ENCOUNTER — Telehealth: Payer: Self-pay | Admitting: *Deleted

## 2018-07-31 NOTE — Telephone Encounter (Signed)
-----   Message from Satira Sark, MD sent at 07/25/2018  4:43 PM EST ----- Results reviewed.  Follow-up echocardiogram shows normal LVEF in the range of 55 to 60%.  Continue with current treatment plan. A copy of this test should be forwarded to Monico Blitz, MD.

## 2018-07-31 NOTE — Telephone Encounter (Signed)
Patient informed. Copy sent to PCP °

## 2018-10-21 ENCOUNTER — Ambulatory Visit (INDEPENDENT_AMBULATORY_CARE_PROVIDER_SITE_OTHER): Payer: Medicare Other | Admitting: *Deleted

## 2018-10-21 DIAGNOSIS — I442 Atrioventricular block, complete: Secondary | ICD-10-CM | POA: Diagnosis not present

## 2018-10-21 LAB — CUP PACEART REMOTE DEVICE CHECK
Battery Remaining Longevity: 116 mo
Battery Remaining Percentage: 95.5 %
Battery Voltage: 3.02 V
Brady Statistic RV Percent Paced: 99 %
Date Time Interrogation Session: 20200413060015
Implantable Lead Implant Date: 20070829
Implantable Lead Implant Date: 20070829
Implantable Lead Location: 753859
Implantable Lead Location: 753860
Implantable Pulse Generator Implant Date: 20170418
Lead Channel Impedance Value: 580 Ohm
Lead Channel Pacing Threshold Amplitude: 1 V
Lead Channel Pacing Threshold Pulse Width: 0.5 ms
Lead Channel Sensing Intrinsic Amplitude: 5.7 mV
Lead Channel Setting Pacing Amplitude: 2.5 V
Lead Channel Setting Pacing Pulse Width: 0.5 ms
Lead Channel Setting Sensing Sensitivity: 4 mV
Pulse Gen Model: 1240
Pulse Gen Serial Number: 7891166

## 2018-10-31 ENCOUNTER — Encounter: Payer: Self-pay | Admitting: Cardiology

## 2018-10-31 NOTE — Progress Notes (Signed)
Remote pacemaker transmission.   

## 2019-01-20 ENCOUNTER — Ambulatory Visit (INDEPENDENT_AMBULATORY_CARE_PROVIDER_SITE_OTHER): Payer: Medicare Other | Admitting: *Deleted

## 2019-01-20 DIAGNOSIS — I442 Atrioventricular block, complete: Secondary | ICD-10-CM

## 2019-01-20 DIAGNOSIS — I4821 Permanent atrial fibrillation: Secondary | ICD-10-CM

## 2019-01-20 LAB — CUP PACEART REMOTE DEVICE CHECK
Battery Remaining Longevity: 118 mo
Battery Remaining Percentage: 95.5 %
Battery Voltage: 3.02 V
Brady Statistic RV Percent Paced: 99 %
Date Time Interrogation Session: 20200713060011
Implantable Lead Implant Date: 20070829
Implantable Lead Implant Date: 20070829
Implantable Lead Location: 753859
Implantable Lead Location: 753860
Implantable Pulse Generator Implant Date: 20170418
Lead Channel Impedance Value: 610 Ohm
Lead Channel Pacing Threshold Amplitude: 1 V
Lead Channel Pacing Threshold Pulse Width: 0.5 ms
Lead Channel Sensing Intrinsic Amplitude: 11.2 mV
Lead Channel Setting Pacing Amplitude: 2.5 V
Lead Channel Setting Pacing Pulse Width: 0.5 ms
Lead Channel Setting Sensing Sensitivity: 4 mV
Pulse Gen Model: 1240
Pulse Gen Serial Number: 7891166

## 2019-02-03 NOTE — Progress Notes (Signed)
Remote pacemaker transmission.   

## 2019-03-07 ENCOUNTER — Ambulatory Visit: Payer: Medicare Other | Admitting: Cardiology

## 2019-03-13 ENCOUNTER — Telehealth: Payer: Self-pay | Admitting: Internal Medicine

## 2019-03-13 NOTE — Telephone Encounter (Signed)
Virtual Visit Pre-Appointment Phone Call  "(Name), I am calling you today to discuss your upcoming appointment. We are currently trying to limit exposure to the virus that causes COVID-19 by seeing patients at home rather than in the office."  1. "What is the BEST phone number to call the day of the visit?" - include this in appointment notes  2. Do you have or have access to (through a family member/friend) a smartphone with video capability that we can use for your visit?" a. If yes - list this number in appt notes as cell (if different from BEST phone #) and list the appointment type as a VIDEO visit in appointment notes b. If no - list the appointment type as a PHONE visit in appointment notes  Confirm consent - "In the setting of the current Covid19 crisis, you are scheduled for a (phone or video) visit with your provider on (date) at (time).  Just as we do with many in-office visits, in order for you to participate in this visit, we must obtain consent.  If you'd like, I can send this to your mychart (if signed up) or email for you to review.  Otherwise, I can obtain your verbal consent now.  All virtual visits are billed to your insurance company just like a normal visit would be.  By agreeing to a virtual visit, we'd like you to understand that the technology does not allow for your provider to perform an examination, and thus may limit your provider's ability to fully assess your condition. If your provider identifies any concerns that need to be evaluated in person, we will make arrangements to do so.  Finally, though the technology is pretty good, we cannot assure that it will always work on either your or our end, and in the setting of a video visit, we may have to convert it to a phone-only visit.  In either situation, we cannot ensure that we have a secure connection.  Are you willing to proceed?" STAFF: Did the patient verbally acknowledge consent to telehealth visit? Document  YES/NO here: yes 3. Advise patient to be prepared - "Two hours prior to your appointment, go ahead and check your blood pressure, pulse, oxygen saturation, and your weight (if you have the equipment to check those) and write them all down. When your visit starts, your provider will ask you for this information. If you have an Apple Watch or Kardia device, please plan to have heart rate information ready on the day of your appointment. Please have a pen and paper handy nearby the day of the visit as well."  4. Give patient instructions for MyChart download to smartphone OR Doximity/Doxy.me as below if video visit (depending on what platform provider is using)  5. Inform patient they will receive a phone call 15 minutes prior to their appointment time (may be from unknown caller ID) so they should be prepared to answer    TELEPHONE CALL NOTE  [name] [name] has been deemed a candidate for a follow-up tele-health visit to limit community exposure during the Covid-19 pandemic. I spoke with the patient via phone to ensure availability of phone/video source, confirm preferred email & phone number, and discuss instructions and expectations.  I reminded [name] [name] to be prepared with any vital sign and/or heart rhythm information that could potentially be obtained via home monitoring, at the time of her visit. I reminded [name] [name] to expect a phone call prior to her visit.  [name] [name] 03/13/2019 10:41 AM   INSTRUCTIONS FOR DOWNLOADING THE MYCHART APP TO SMARTPHONE  - The patient must first make sure to have activated MyChart and know their login information - If Apple, go to [employer] and type in MyChart in the search bar and download the app. If Android, ask patient to go to [employer] and type in [facility] in the search bar and download the app. The app is free but as with any other app downloads, their phone may require them to verify saved payment information or  Apple/Android password.  - The patient will need to then log into the app with their MyChart username and password, and select [facility] as their healthcare provider to link the account. When it is time for your visit, go to the MyChart app, find appointments, and click Begin Video Visit. Be sure to Select Allow for your device to access the Microphone and Camera for your visit. You will then be connected, and your provider will be with you shortly.  **If they have any issues connecting, or need assistance please contact MyChart service desk [phone])**  **If using a computer, in order to ensure the best quality for their visit they will need to use either of the following Internet Browsers: [employer], or Google Chrome**  IF USING DOXIMITY or DOXY.ME - The patient will receive a link just prior to their visit by text.     FULL LENGTH CONSENT FOR TELE-HEALTH VISIT   I hereby voluntarily request, consent and authorize [facility] and its employed or contracted physicians, physician assistants, nurse practitioners or other licensed health care professionals (the Practitioner), to provide me with telemedicine health care services (the Services") as deemed necessary by the treating Practitioner. I acknowledge and consent to receive the Services by the Practitioner via telemedicine. I understand that the telemedicine visit will involve communicating with the Practitioner through live audiovisual communication technology and the disclosure of certain medical information by electronic transmission. I acknowledge that I have been given the opportunity to request an in-person assessment or other available alternative prior to the telemedicine visit and am voluntarily participating in the telemedicine visit.  I understand that I have the right to withhold or withdraw my consent to the use of telemedicine in the course of my care at any time, without affecting my right to future care  or treatment, and that the Practitioner or I may terminate the telemedicine visit at any time. I understand that I have the right to inspect all information obtained and/or recorded in the course of the telemedicine visit and may receive copies of available information for a reasonable fee.  I understand that some of the potential risks of receiving the Services via telemedicine include:   Delay or interruption in medical evaluation due to technological equipment failure or disruption;  Information transmitted may not be sufficient (e.g. poor resolution of images) to allow for appropriate medical decision making by the Practitioner; and/or   In rare instances, security protocols could fail, causing a breach of personal health information.  Furthermore, I acknowledge that it is my responsibility to provide information about my medical history, conditions and care that is complete and accurate to the best of my ability. I acknowledge that Practitioner's advice, recommendations, and/or decision may be based on factors not within their control, such as incomplete or inaccurate data provided by me or distortions of diagnostic images or specimens that may result from electronic transmissions. I understand that the  practice of medicine is not an Chief Strategy Officer and that Practitioner makes no warranties or guarantees regarding treatment outcomes. I acknowledge that I will receive a copy of this consent concurrently upon execution via email to the email address I last provided but may also request a printed copy by calling the office of [facility].    I understand that my insurance will be billed for this visit.   I have read or had this consent read to me.  I understand the contents of this consent, which adequately explains the benefits and risks of the Services being provided via telemedicine.   I have been provided ample opportunity to ask questions regarding this consent and the Services and have had  my questions answered to my satisfaction.  I give my informed consent for the services to be provided through the use of telemedicine in my medical care  By participating in this telemedicine visit I agree to the above.

## 2019-03-14 ENCOUNTER — Telehealth: Payer: Medicare Other | Admitting: Internal Medicine

## 2019-04-22 ENCOUNTER — Ambulatory Visit (INDEPENDENT_AMBULATORY_CARE_PROVIDER_SITE_OTHER): Payer: Medicare Other | Admitting: *Deleted

## 2019-04-22 DIAGNOSIS — I442 Atrioventricular block, complete: Secondary | ICD-10-CM

## 2019-04-22 DIAGNOSIS — I4821 Permanent atrial fibrillation: Secondary | ICD-10-CM

## 2019-04-22 LAB — CUP PACEART REMOTE DEVICE CHECK
Battery Remaining Longevity: 116 mo
Battery Remaining Percentage: 95.5 %
Battery Voltage: 3.01 V
Brady Statistic RV Percent Paced: 99 %
Date Time Interrogation Session: 20201012073003
Implantable Lead Implant Date: 20070829
Implantable Lead Implant Date: 20070829
Implantable Lead Location: 753859
Implantable Lead Location: 753860
Implantable Pulse Generator Implant Date: 20170418
Lead Channel Impedance Value: 550 Ohm
Lead Channel Pacing Threshold Amplitude: 1 V
Lead Channel Pacing Threshold Pulse Width: 0.5 ms
Lead Channel Sensing Intrinsic Amplitude: 10.6 mV
Lead Channel Setting Pacing Amplitude: 2.5 V
Lead Channel Setting Pacing Pulse Width: 0.5 ms
Lead Channel Setting Sensing Sensitivity: 4 mV
Pulse Gen Model: 1240
Pulse Gen Serial Number: 7891166

## 2019-05-05 NOTE — Progress Notes (Signed)
Remote pacemaker transmission.   

## 2019-07-22 ENCOUNTER — Ambulatory Visit (INDEPENDENT_AMBULATORY_CARE_PROVIDER_SITE_OTHER): Payer: Medicare Other | Admitting: *Deleted

## 2019-07-22 DIAGNOSIS — I442 Atrioventricular block, complete: Secondary | ICD-10-CM

## 2019-07-22 LAB — CUP PACEART REMOTE DEVICE CHECK
Battery Remaining Longevity: 116 mo
Battery Remaining Percentage: 95.5 %
Battery Voltage: 3.01 V
Brady Statistic RV Percent Paced: 99 %
Date Time Interrogation Session: 20210111020013
Implantable Lead Implant Date: 20070829
Implantable Lead Implant Date: 20070829
Implantable Lead Location: 753859
Implantable Lead Location: 753860
Implantable Pulse Generator Implant Date: 20170418
Lead Channel Impedance Value: 580 Ohm
Lead Channel Pacing Threshold Amplitude: 1 V
Lead Channel Pacing Threshold Pulse Width: 0.5 ms
Lead Channel Sensing Intrinsic Amplitude: 7.9 mV
Lead Channel Setting Pacing Amplitude: 2.5 V
Lead Channel Setting Pacing Pulse Width: 0.5 ms
Lead Channel Setting Sensing Sensitivity: 4 mV
Pulse Gen Model: 1240
Pulse Gen Serial Number: 7891166

## 2019-10-21 ENCOUNTER — Ambulatory Visit (INDEPENDENT_AMBULATORY_CARE_PROVIDER_SITE_OTHER): Payer: Medicare Other | Admitting: *Deleted

## 2019-10-21 DIAGNOSIS — I442 Atrioventricular block, complete: Secondary | ICD-10-CM

## 2019-10-21 LAB — CUP PACEART REMOTE DEVICE CHECK
Battery Remaining Longevity: 117 mo
Battery Remaining Percentage: 95.5 %
Battery Voltage: 3.01 V
Brady Statistic RV Percent Paced: 99 %
Date Time Interrogation Session: 20210413020014
Implantable Lead Implant Date: 20070829
Implantable Lead Implant Date: 20070829
Implantable Lead Location: 753859
Implantable Lead Location: 753860
Implantable Pulse Generator Implant Date: 20170418
Lead Channel Impedance Value: 580 Ohm
Lead Channel Pacing Threshold Amplitude: 1 V
Lead Channel Pacing Threshold Pulse Width: 0.5 ms
Lead Channel Sensing Intrinsic Amplitude: 8.2 mV
Lead Channel Setting Pacing Amplitude: 2.5 V
Lead Channel Setting Pacing Pulse Width: 0.5 ms
Lead Channel Setting Sensing Sensitivity: 4 mV
Pulse Gen Model: 1240
Pulse Gen Serial Number: 7891166

## 2019-10-22 NOTE — Progress Notes (Signed)
PPM Remote  

## 2020-01-20 ENCOUNTER — Ambulatory Visit (INDEPENDENT_AMBULATORY_CARE_PROVIDER_SITE_OTHER): Payer: Medicare Other | Admitting: *Deleted

## 2020-01-20 DIAGNOSIS — I442 Atrioventricular block, complete: Secondary | ICD-10-CM | POA: Diagnosis not present

## 2020-01-20 LAB — CUP PACEART REMOTE DEVICE CHECK
Battery Remaining Longevity: 116 mo
Battery Remaining Percentage: 95.5 %
Battery Voltage: 3.01 V
Brady Statistic RV Percent Paced: 99 %
Date Time Interrogation Session: 20210713020018
Implantable Lead Implant Date: 20070829
Implantable Lead Implant Date: 20070829
Implantable Lead Location: 753859
Implantable Lead Location: 753860
Implantable Pulse Generator Implant Date: 20170418
Lead Channel Impedance Value: 550 Ohm
Lead Channel Pacing Threshold Amplitude: 1 V
Lead Channel Pacing Threshold Pulse Width: 0.5 ms
Lead Channel Sensing Intrinsic Amplitude: 8.2 mV
Lead Channel Setting Pacing Amplitude: 2.5 V
Lead Channel Setting Pacing Pulse Width: 0.5 ms
Lead Channel Setting Sensing Sensitivity: 4 mV
Pulse Gen Model: 1240
Pulse Gen Serial Number: 7891166

## 2020-01-21 NOTE — Progress Notes (Signed)
Remote pacemaker transmission.   

## 2020-04-20 ENCOUNTER — Ambulatory Visit (INDEPENDENT_AMBULATORY_CARE_PROVIDER_SITE_OTHER): Payer: Medicare Other

## 2020-04-20 DIAGNOSIS — I442 Atrioventricular block, complete: Secondary | ICD-10-CM

## 2020-04-22 LAB — CUP PACEART REMOTE DEVICE CHECK
Battery Remaining Longevity: 116 mo
Battery Remaining Percentage: 95.5 %
Battery Voltage: 3.01 V
Brady Statistic RV Percent Paced: 99 %
Date Time Interrogation Session: 20211013132346
Implantable Lead Implant Date: 20070829
Implantable Lead Implant Date: 20070829
Implantable Lead Location: 753859
Implantable Lead Location: 753860
Implantable Pulse Generator Implant Date: 20170418
Lead Channel Impedance Value: 580 Ohm
Lead Channel Pacing Threshold Amplitude: 1 V
Lead Channel Pacing Threshold Pulse Width: 0.5 ms
Lead Channel Sensing Intrinsic Amplitude: 7.3 mV
Lead Channel Setting Pacing Amplitude: 2.5 V
Lead Channel Setting Pacing Pulse Width: 0.5 ms
Lead Channel Setting Sensing Sensitivity: 4 mV
Pulse Gen Model: 1240
Pulse Gen Serial Number: 7891166

## 2020-04-23 NOTE — Progress Notes (Signed)
Remote pacemaker transmission.   

## 2020-07-20 ENCOUNTER — Ambulatory Visit (INDEPENDENT_AMBULATORY_CARE_PROVIDER_SITE_OTHER): Payer: Medicare Other

## 2020-07-20 DIAGNOSIS — I442 Atrioventricular block, complete: Secondary | ICD-10-CM

## 2020-07-21 LAB — CUP PACEART REMOTE DEVICE CHECK
Battery Remaining Longevity: 116 mo
Battery Remaining Percentage: 95.5 %
Battery Voltage: 3.01 V
Brady Statistic RV Percent Paced: 99 %
Date Time Interrogation Session: 20220111020013
Implantable Lead Implant Date: 20070829
Implantable Lead Implant Date: 20070829
Implantable Lead Location: 753859
Implantable Lead Location: 753860
Implantable Pulse Generator Implant Date: 20170418
Lead Channel Impedance Value: 560 Ohm
Lead Channel Pacing Threshold Amplitude: 1 V
Lead Channel Pacing Threshold Pulse Width: 0.5 ms
Lead Channel Sensing Intrinsic Amplitude: 8.6 mV
Lead Channel Setting Pacing Amplitude: 2.5 V
Lead Channel Setting Pacing Pulse Width: 0.5 ms
Lead Channel Setting Sensing Sensitivity: 4 mV
Pulse Gen Model: 1240
Pulse Gen Serial Number: 7891166

## 2020-08-06 NOTE — Progress Notes (Signed)
Remote pacemaker transmission.   

## 2020-10-19 ENCOUNTER — Ambulatory Visit (INDEPENDENT_AMBULATORY_CARE_PROVIDER_SITE_OTHER): Payer: Medicare Other

## 2020-10-19 DIAGNOSIS — I442 Atrioventricular block, complete: Secondary | ICD-10-CM

## 2020-10-19 LAB — CUP PACEART REMOTE DEVICE CHECK
Battery Remaining Longevity: 116 mo
Battery Remaining Percentage: 95.5 %
Battery Voltage: 3.01 V
Brady Statistic RV Percent Paced: 99 %
Date Time Interrogation Session: 20220412020013
Implantable Lead Implant Date: 20070829
Implantable Lead Implant Date: 20070829
Implantable Lead Location: 753859
Implantable Lead Location: 753860
Implantable Pulse Generator Implant Date: 20170418
Lead Channel Impedance Value: 560 Ohm
Lead Channel Pacing Threshold Amplitude: 1 V
Lead Channel Pacing Threshold Pulse Width: 0.5 ms
Lead Channel Sensing Intrinsic Amplitude: 11 mV
Lead Channel Setting Pacing Amplitude: 2.5 V
Lead Channel Setting Pacing Pulse Width: 0.5 ms
Lead Channel Setting Sensing Sensitivity: 4 mV
Pulse Gen Model: 1240
Pulse Gen Serial Number: 7891166

## 2020-11-03 NOTE — Progress Notes (Signed)
Remote pacemaker transmission.   

## 2021-01-18 ENCOUNTER — Ambulatory Visit (INDEPENDENT_AMBULATORY_CARE_PROVIDER_SITE_OTHER): Payer: Medicare Other

## 2021-01-18 DIAGNOSIS — I442 Atrioventricular block, complete: Secondary | ICD-10-CM | POA: Diagnosis not present

## 2021-01-18 LAB — CUP PACEART REMOTE DEVICE CHECK
Battery Remaining Longevity: 69 mo
Battery Remaining Percentage: 61 %
Battery Voltage: 3.01 V
Brady Statistic RV Percent Paced: 99 %
Date Time Interrogation Session: 20220712020014
Implantable Lead Implant Date: 20070829
Implantable Lead Implant Date: 20070829
Implantable Lead Location: 753859
Implantable Lead Location: 753860
Implantable Pulse Generator Implant Date: 20170418
Lead Channel Impedance Value: 550 Ohm
Lead Channel Pacing Threshold Amplitude: 1 V
Lead Channel Pacing Threshold Pulse Width: 0.5 ms
Lead Channel Sensing Intrinsic Amplitude: 11.9 mV
Lead Channel Setting Pacing Amplitude: 2.5 V
Lead Channel Setting Pacing Pulse Width: 0.5 ms
Lead Channel Setting Sensing Sensitivity: 4 mV
Pulse Gen Model: 1240
Pulse Gen Serial Number: 7891166

## 2021-02-10 NOTE — Progress Notes (Signed)
Remote pacemaker transmission.   

## 2021-02-18 ENCOUNTER — Telehealth: Payer: Self-pay

## 2021-02-18 NOTE — Telephone Encounter (Signed)
Daughter called and reports patient this morning felt a flutter her chest briefly then resolved. Denies any chest pain, shortness of breath or other symptoms. Patient advised her remote does not show any concerns. Advised to monitor and call if happens further. Also advised she may follow up with PCP. Patient is overdue, message sent to scheduler to call to schedule.   ED precautions discussed.

## 2021-04-19 ENCOUNTER — Ambulatory Visit (INDEPENDENT_AMBULATORY_CARE_PROVIDER_SITE_OTHER): Payer: Medicare Other

## 2021-04-19 DIAGNOSIS — I442 Atrioventricular block, complete: Secondary | ICD-10-CM

## 2021-04-19 LAB — CUP PACEART REMOTE DEVICE CHECK
Battery Remaining Longevity: 67 mo
Battery Remaining Percentage: 59 %
Battery Voltage: 3.01 V
Brady Statistic RV Percent Paced: 99 %
Date Time Interrogation Session: 20221011020029
Implantable Lead Implant Date: 20070829
Implantable Lead Implant Date: 20070829
Implantable Lead Location: 753859
Implantable Lead Location: 753860
Implantable Pulse Generator Implant Date: 20170418
Lead Channel Impedance Value: 550 Ohm
Lead Channel Pacing Threshold Amplitude: 1 V
Lead Channel Pacing Threshold Pulse Width: 0.5 ms
Lead Channel Sensing Intrinsic Amplitude: 12 mV
Lead Channel Setting Pacing Amplitude: 2.5 V
Lead Channel Setting Pacing Pulse Width: 0.5 ms
Lead Channel Setting Sensing Sensitivity: 4 mV
Pulse Gen Model: 1240
Pulse Gen Serial Number: 7891166

## 2021-04-27 NOTE — Progress Notes (Signed)
Remote pacemaker transmission.   

## 2021-05-13 ENCOUNTER — Ambulatory Visit: Payer: Medicare Other | Admitting: Internal Medicine

## 2021-05-13 VITALS — BP 136/88 | HR 71 | Ht 64.0 in | Wt 205.4 lb

## 2021-05-13 DIAGNOSIS — I10 Essential (primary) hypertension: Secondary | ICD-10-CM

## 2021-05-13 DIAGNOSIS — D6869 Other thrombophilia: Secondary | ICD-10-CM | POA: Diagnosis not present

## 2021-05-13 DIAGNOSIS — I442 Atrioventricular block, complete: Secondary | ICD-10-CM

## 2021-05-13 DIAGNOSIS — I4821 Permanent atrial fibrillation: Secondary | ICD-10-CM | POA: Diagnosis not present

## 2021-05-13 LAB — CUP PACEART INCLINIC DEVICE CHECK
Battery Remaining Longevity: 49 mo
Battery Voltage: 3.01 V
Brady Statistic RV Percent Paced: 99.76 %
Date Time Interrogation Session: 20221104150556
Implantable Lead Implant Date: 20070829
Implantable Lead Location: 753860
Implantable Pulse Generator Implant Date: 20170418
Lead Channel Impedance Value: 587.5 Ohm
Lead Channel Pacing Threshold Amplitude: 1 V
Lead Channel Pacing Threshold Pulse Width: 0.9 ms
Lead Channel Sensing Intrinsic Amplitude: 12 mV
Lead Channel Setting Pacing Amplitude: 3 V
Lead Channel Setting Pacing Pulse Width: 0.5 ms
Lead Channel Setting Sensing Sensitivity: 4 mV
Pulse Gen Model: 1240
Pulse Gen Serial Number: 7891166

## 2021-05-13 NOTE — Progress Notes (Signed)
PCP: Monico Blitz, MD Primary Cardiologist: Dr Domenic Polite Primary EP:  Dr Rayann Heman  [name] [name] is a 87 y.o. female who presents today for routine electrophysiology followup.  She has not been seen in the office in quite some time.  She has had chronic issues with arthritis and is not active.  SOB is stable.  Today, she denies symptoms of palpitations, chest pain,  lower extremity edema, dizziness, presyncope, or syncope.  The patient is otherwise without complaint today.   Past Medical History:  Diagnosis Date   Breast cancer ([facility])    Bilateral s/p mastectomies   Essential hypertension, benign    Hypothyroidism    Nonischemic cardiomyopathy ([facility])    Question tachycardia mediated   OSA (obstructive sleep apnea)    Permanent atrial fibrillation    Status post atrioventricular nodal ablation/St. Jude dual  chamber pacemaker implantation., chronic coumadin therapy followed by Dr. Manuella Ghazi   Renal insufficiency    Type 2 diabetes mellitus [facility])    Past Surgical History:  Procedure Laterality Date   EP IMPLANTABLE DEVICE N/A 10/26/2015   St Jude Assurity pacemaker generator change by Dr Rayann Heman   PACEMAKER PLACEMENT     St. Jude - follows with Dr. Rayann Heman    ROS- all systems are reviewed and negative except as per HPI above  Current Outpatient Medications  Medication Sig Dispense Refill   allopurinol (ZYLOPRIM) 100 MG tablet Take 100 mg by mouth daily.     ALPRAZolam (XANAX) 0.5 MG tablet Take 0.5 mg by mouth at bedtime.     cefUROXime (CEFTIN) 500 MG tablet Take 1 tablet by mouth 2 (two) times daily.  0   Cholecalciferol (VITAMIN D3) 5000 units CAPS Take 1 capsule by mouth daily.     colchicine 0.6 MG tablet Take 0.6 mg by mouth as needed.     furosemide (LASIX) 40 MG tablet Take 40 mg by mouth daily.     glimepiride (AMARYL) 4 MG tablet Take 1 tablet by mouth daily.     insulin lispro (HUMALOG) 100 UNIT/ML KiwkPen Inject 10 Units into the skin 2 (two) times daily.       levothyroxine (SYNTHROID, LEVOTHROID) 88 MCG tablet Take 88 mcg by mouth daily before breakfast.     losartan-hydrochlorothiazide (HYZAAR) 50-12.5 MG tablet Take 1 tablet by mouth daily.     metFORMIN (GLUCOPHAGE) 500 MG tablet Take 500 mg by mouth every evening.     NON FORMULARY FREE STYLE LIBERAL     Omega-3 Fatty Acids (FISH OIL ULTRA) 1400 MG CAPS Take by mouth.     potassium chloride (K-DUR,KLOR-CON) 10 MEQ tablet Take 10 mEq by mouth daily.     TOUJEO SOLOSTAR 300 UNIT/ML SOPN Inject 20 Units as directed every morning.  1   warfarin (COUMADIN) 5 MG tablet Take 5 mg by mouth daily at 6 PM. Managed by Dr. Manuella Ghazi,  2.5 mg on Mon, Wed and Fri, 5 mg all other days     oxazepam (SERAX) 10 MG capsule Take 1 capsule by mouth daily.     No current facility-administered medications for this visit.    Physical Exam: Vitals:   05/13/21 1447  BP: 136/88  Pulse: 71  SpO2: 90%  Weight: 205 lb 6.4 oz (93.2 kg)  Height: 5\' 4"  (1.626 m)    GEN- The patient is well appearing, alert and oriented x 3 today.   Head- normocephalic, atraumatic Eyes-  Sclera clear, conjunctiva pink Ears- hearing intact Oropharynx- clear Lungs- Clear  to ausculation bilaterally, normal work of breathing Chest- pacemaker pocket is well healed Heart- Regular rate and rhythm (V paced) GI- soft, NT, ND, + BS Extremities- no clubbing, cyanosis, +2 edema  Pacemaker interrogation- reviewed in detail today,  See PACEART report  ekg tracing ordered today is personally reviewed and shows afib, V paced  Assessment and Plan:  1. Symptomatic complete heart block Normal pacemaker function See Pace Art report RV threshold is 1.5V @0 .5 msec,  1V@0 .9 msec.  I have thefore adjusted output to 2.5V@0 .29msec she is device dependant today   2. Permanent atrial fibrillation Chads2vasc score is 5 S/p AV nodal ablation by Dr Caryl Comes  3. HTN Stable No change required today  Return in a year  Thompson Grayer MD,  [facility] 05/13/2021 3:03 PM

## 2021-05-13 NOTE — Patient Instructions (Signed)
Medication Instructions:  Continue all current medications.  Labwork: none  Testing/Procedures: none  Follow-Up: 1 year - Dr.  Allred   Any Other Special Instructions Will Be Listed Below (If Applicable).   If you need a refill on your cardiac medications before your next appointment, please call your pharmacy.  

## 2021-07-19 ENCOUNTER — Ambulatory Visit (INDEPENDENT_AMBULATORY_CARE_PROVIDER_SITE_OTHER): Payer: Medicare Other

## 2021-07-19 DIAGNOSIS — I442 Atrioventricular block, complete: Secondary | ICD-10-CM | POA: Diagnosis not present

## 2021-07-19 LAB — CUP PACEART REMOTE DEVICE CHECK
Battery Remaining Longevity: 55 mo
Battery Remaining Percentage: 57 %
Battery Voltage: 2.99 V
Brady Statistic RV Percent Paced: 99 %
Date Time Interrogation Session: 20230110020014
Implantable Lead Implant Date: 20070829
Implantable Lead Location: 753860
Implantable Pulse Generator Implant Date: 20170418
Lead Channel Impedance Value: 540 Ohm
Lead Channel Pacing Threshold Amplitude: 1 V
Lead Channel Pacing Threshold Pulse Width: 0.9 ms
Lead Channel Sensing Intrinsic Amplitude: 12 mV
Lead Channel Setting Pacing Amplitude: 2.5 V
Lead Channel Setting Pacing Pulse Width: 0.9 ms
Lead Channel Setting Sensing Sensitivity: 4 mV
Pulse Gen Model: 1240
Pulse Gen Serial Number: 7891166

## 2021-07-28 NOTE — Progress Notes (Signed)
Remote pacemaker transmission.   

## 2021-10-13 ENCOUNTER — Emergency Department (HOSPITAL_COMMUNITY): Payer: Medicare Other

## 2021-10-13 ENCOUNTER — Encounter (HOSPITAL_COMMUNITY): Payer: Self-pay | Admitting: Emergency Medicine

## 2021-10-13 ENCOUNTER — Emergency Department (HOSPITAL_COMMUNITY)
Admission: EM | Admit: 2021-10-13 | Discharge: 2021-10-14 | Disposition: A | Discharge status: Home / Self Care | Payer: Medicare Other | Attending: Emergency Medicine | Admitting: Emergency Medicine

## 2021-10-13 DIAGNOSIS — I517 Cardiomegaly: Secondary | ICD-10-CM | POA: Insufficient documentation

## 2021-10-13 DIAGNOSIS — Z20822 Contact with and (suspected) exposure to covid-19: Secondary | ICD-10-CM | POA: Diagnosis not present

## 2021-10-13 DIAGNOSIS — I4891 Unspecified atrial fibrillation: Secondary | ICD-10-CM | POA: Insufficient documentation

## 2021-10-13 DIAGNOSIS — R509 Fever, unspecified: Secondary | ICD-10-CM | POA: Insufficient documentation

## 2021-10-13 DIAGNOSIS — M79662 Pain in left lower leg: Secondary | ICD-10-CM | POA: Diagnosis not present

## 2021-10-13 DIAGNOSIS — I700 Atherosclerosis of aorta: Secondary | ICD-10-CM | POA: Insufficient documentation

## 2021-10-13 DIAGNOSIS — Z79899 Other long term (current) drug therapy: Secondary | ICD-10-CM | POA: Diagnosis not present

## 2021-10-13 DIAGNOSIS — R109 Unspecified abdominal pain: Secondary | ICD-10-CM | POA: Diagnosis not present

## 2021-10-13 DIAGNOSIS — Z7901 Long term (current) use of anticoagulants: Secondary | ICD-10-CM

## 2021-10-13 DIAGNOSIS — R531 Weakness: Secondary | ICD-10-CM

## 2021-10-13 DIAGNOSIS — Z950 Presence of cardiac pacemaker: Secondary | ICD-10-CM | POA: Diagnosis not present

## 2021-10-13 LAB — BASIC METABOLIC PANEL
Anion gap: 6 (ref 5–15)
BUN: 19 mg/dL (ref 8–23)
CO2: 27 mmol/L (ref 22–32)
Calcium: 8.5 mg/dL — ABNORMAL LOW (ref 8.9–10.3)
Chloride: 101 mmol/L (ref 98–111)
Creatinine, Ser: 1.08 mg/dL — ABNORMAL HIGH (ref 0.44–1.00)
GFR, Estimated: 49 mL/min — ABNORMAL LOW (ref >60)
Glucose, Bld: 235 mg/dL — ABNORMAL HIGH (ref 70–99)
Potassium: 4.2 mmol/L (ref 3.5–5.1)
Sodium: 134 mmol/L — ABNORMAL LOW (ref 135–145)

## 2021-10-13 LAB — CBG MONITORING, ED: Glucose-Capillary: 220 mg/dL — ABNORMAL HIGH (ref 70–99)

## 2021-10-13 LAB — URINALYSIS, ROUTINE W REFLEX MICROSCOPIC
Bilirubin Urine: NEGATIVE
Glucose, UA: 150 mg/dL — AB
Ketones, ur: NEGATIVE mg/dL
Leukocytes,Ua: NEGATIVE
Nitrite: NEGATIVE
Protein, ur: 100 mg/dL — AB
Specific Gravity, Urine: 1.017 (ref 1.005–1.030)
Trans Epithel, UA: <1
pH: 6 (ref 5.0–8.0)

## 2021-10-13 LAB — CBC
HCT: 45.3 % (ref 36.0–46.0)
Hemoglobin: 14.5 g/dL (ref 12.0–15.0)
MCH: 29.7 pg (ref 26.0–34.0)
MCHC: 32 g/dL (ref 30.0–36.0)
MCV: 92.6 fL (ref 80.0–100.0)
Platelets: 211 10*3/uL (ref 150–400)
RBC: 4.89 MIL/uL (ref 3.87–5.11)
RDW: 14 % (ref 11.5–15.5)
WBC: 8.3 10*3/uL (ref 4.0–10.5)
nRBC: 0 % (ref 0.0–0.2)

## 2021-10-13 LAB — RESP PANEL BY RT-PCR (FLU A&B, COVID) ARPGX2
Influenza A by PCR: NEGATIVE
Influenza B by PCR: NEGATIVE
SARS Coronavirus 2 by RT PCR: NEGATIVE

## 2021-10-13 LAB — LACTIC ACID, PLASMA: Lactic Acid, Venous: 1.4 mmol/L (ref 0.5–1.9)

## 2021-10-13 LAB — PROTIME-INR
INR: 2.1 — ABNORMAL HIGH (ref 0.8–1.2)
Prothrombin Time: 23.6 seconds — ABNORMAL HIGH (ref 11.4–15.2)

## 2021-10-13 MED ORDER — CEFAZOLIN SODIUM-DEXTROSE 1-4 GM/50ML-% IV SOLN
1.0000 g | Freq: Once | INTRAVENOUS | Status: AC
  Administered 2021-10-14: 1 g via INTRAVENOUS
  Filled 2021-10-13 (×2): qty 50

## 2021-10-13 MED ORDER — IOHEXOL 300 MG/ML  SOLN
100.0000 mL | Freq: Once | INTRAMUSCULAR | Status: AC | PRN
  Administered 2021-10-14: 100 mL via INTRAVENOUS

## 2021-10-13 NOTE — ED Triage Notes (Signed)
Pt brought in by EMS by [facility] due to family request. Pt had PT/INR completed this morning and results came back elevated so they gave her an extra dose of warfarin. Famly concerned for DVT due to BLE pain. ?

## 2021-10-13 NOTE — Discharge Instructions (Addendum)
It was our pleasure to provide your ER care today - we hope that you feel better. ? ?Drink plenty of fluids/stay well hydrated.  ? ?Take acetaminophen as need. Take keflex (antibiotic) as prescribed.  ? ?Follow up with primary care doctor in the coming week. For coumadin level/INR is 2.1 today - discuss with your doctor tomorrow, and discuss whether they want to make any further adjustment in your coumadin dose.  ? ?Return if worse, new symptoms, new/severe pain, trouble breathing, severe abdominal pain, or other concern.  ?

## 2021-10-13 NOTE — ED Provider Notes (Addendum)
?[facility] ?Provider Note ? ? ?CSN: [id number] ?Arrival date & time: 10/13/21  1936 ? ?  ? ?History ? ?Chief Complaint  ?Patient presents with  ? Abnormal Lab  ? ? ?[name] [name] is a 88 y.o. female. ? ?Patient with hx afib/pacemaker, presents via EMS from SNF, with reports of family member requesting transport to ED for eval, ? Gen weakness. Pt is noted to have low grade fever on arrival to ED (no reports of fever at facility).   Per reports, inr 2 on outpatient labs, giving extra dose of coumadin today. No abnormal bleeding. Pt denies any specific new c/o, is relatively poor historian. No report of trauma/fall. No report of fever at facility.  Pt denies headache. No chest pain or sob. No abd pain. No nvd. No dysuria. Denies increased leg swelling, or pain.  ? ?The history is provided by the patient, medical records and the EMS personnel. The history is limited by the condition of the patient.  ?Abnormal Lab ? ?  ? ?Home Medications ?Prior to Admission medications   ?Medication Sig Start Date End Date Taking? Authorizing Provider  ?allopurinol (ZYLOPRIM) 100 MG tablet Take 100 mg by mouth daily.    [provider]  ?ALPRAZolam Duanne Moron) 0.5 MG tablet Take 0.5 mg by mouth at bedtime.    [provider]  ?cefUROXime (CEFTIN) 500 MG tablet Take 1 tablet by mouth 2 (two) times daily. 03/08/18   [provider]  ?Cholecalciferol (VITAMIN D3) 5000 units CAPS Take 1 capsule by mouth daily.    [provider]  ?colchicine 0.6 MG tablet Take 0.6 mg by mouth as needed. 01/27/21   [provider]  ?furosemide (LASIX) 40 MG tablet Take 40 mg by mouth daily.    [provider]  ?glimepiride (AMARYL) 4 MG tablet Take 1 tablet by mouth daily.    [provider]  ?insulin lispro (HUMALOG) 100 UNIT/ML KiwkPen Inject 10 Units into the skin 2 (two) times daily.     [provider]  ?levothyroxine (SYNTHROID, LEVOTHROID) 88 MCG tablet Take 88  mcg by mouth daily before breakfast.    [provider]  ?losartan-hydrochlorothiazide (HYZAAR) 50-12.5 MG tablet Take 1 tablet by mouth daily.    [provider]  ?metFORMIN (GLUCOPHAGE) 500 MG tablet Take 500 mg by mouth every evening.    [provider]  ?[place]    [provider]  ?Omega-3 Fatty Acids (FISH OIL ULTRA) 1400 MG CAPS Take by mouth.    [provider]  ?oxazepam (SERAX) 10 MG capsule Take 1 capsule by mouth daily. 04/14/21   [provider]  ?potassium chloride (K-DUR,KLOR-CON) 10 MEQ tablet Take 10 mEq by mouth daily.    [provider]  ?TOUJEO SOLOSTAR 300 UNIT/ML SOPN Inject 20 Units as directed every morning. 03/02/18   [provider]  ?warfarin (COUMADIN) 5 MG tablet Take 5 mg by mouth daily at 6 PM. Managed by Dr. Manuella Ghazi,  ?2.5 mg on Mon, Wed and Fri, 5 mg all other days    [provider]  ?   ? ?Allergies    ?Codeine and Nitrofurantoin   ? ?Review of Systems   ?Review of Systems  ?Constitutional:  Positive for fever.  ?HENT:  Negative for sore throat.   ?Eyes:  Negative for redness.  ?Respiratory:  Negative for cough and shortness of breath.   ?Cardiovascular:  Negative for chest pain.  ?Gastrointestinal:  Negative for  abdominal pain and vomiting.  ?Genitourinary:  Negative for flank pain.  ?Musculoskeletal:  Negative for back pain.  ?Skin:  Negative for rash.  ?Neurological:  Negative for headaches.  ?Psychiatric/Behavioral:  Negative for agitation.   ? ?Physical Exam ?Updated Vital Signs ?BP (!) 163/69 (BP Location: Right Arm)   Pulse 81   Temp 99.5 ?F (37.5 ?C) (Oral)   Resp (!) 22   Ht 1.626 m ('5\' 4"'$ )   Wt 93.2 kg   SpO2 93%   BMI 35.27 kg/m?  ?Physical Exam ?Vitals and nursing note reviewed.  ?Constitutional:   ?   Appearance: Normal appearance. She is well-developed.  ?HENT:  ?   Head: Atraumatic.  ?   Nose: Nose normal.  ?   Mouth/Throat:  ?   Mouth: Mucous membranes are  moist.  ?   Pharynx: Oropharynx is clear.  ?Eyes:  ?   General: No scleral icterus. ?   Conjunctiva/sclera: Conjunctivae normal.  ?   Pupils: Pupils are equal, round, and reactive to light.  ?Neck:  ?   Trachea: No tracheal deviation.  ?   Comments: No stiffness or rigidity, no meningeal signs. Trachea midline. No mass. No l/a.  ?Cardiovascular:  ?   Rate and Rhythm: Normal rate and regular rhythm.  ?   Pulses: Normal pulses.  ?   Heart sounds: Normal heart sounds. No murmur heard. ?  No friction rub. No gallop.  ?Pulmonary:  ?   Effort: Pulmonary effort is normal. No respiratory distress.  ?   Breath sounds: Normal breath sounds.  ?Abdominal:  ?   General: Bowel sounds are normal. There is no distension.  ?   Palpations: Abdomen is soft.  ?   Tenderness: There is no abdominal tenderness.  ?Genitourinary: ?   Comments: No cva tenderness.  ?Musculoskeletal:     ?   General: No tenderness.  ?   Cervical back: Normal range of motion and neck supple. No rigidity. No muscular tenderness.  ?   Comments: No asymmetric leg swelling, or pain/tenderness. Legs overall of normal color and warmth - mild erythema to posterior left lower leg/ankle in area of dry/cracked skin - ?early/mild cellulitis.   Back and sacral/buttock area appears normal - no wounds, ulcers or abscess noted. CTLS spine, non tender, aligned, no step off. ?  ?Lymphadenopathy:  ?   Cervical: No cervical adenopathy.  ?Skin: ?   General: Skin is warm and dry.  ?   Findings: No rash.  ?   Comments: No skin ulcers/lesions noted - see msk, ?possible early/mild cellulitis.   ?Neurological:  ?   Mental Status: She is alert.  ?   Comments: Alert, speech normal. Motor/sens grossly intact bil.   ?Psychiatric:     ?   Mood and Affect: Mood normal.  ? ? ?ED Results / Procedures / Treatments   ?Labs ?(all labs ordered are listed, but only abnormal results are displayed) ?Results for orders placed or performed during the hospital encounter of 10/13/21  ?Blood culture  (routine x 2)  ? Specimen: Right Antecubital; Blood  ?Result Value Ref Range  ? Specimen Description RIGHT ANTECUBITAL   ? Special Requests    ?  BOTTLES DRAWN AEROBIC AND ANAEROBIC Blood Culture results may not be optimal due to an excessive volume of blood received in culture bottles ?Performed at [facility], [address], [place], [place] [address] ?  ? Culture PENDING   ? Report Status PENDING   ?Blood culture (routine x  2)  ? Specimen: BLOOD RIGHT HAND  ?Result Value Ref Range  ? Specimen Description BLOOD RIGHT HAND   ? Special Requests    ?  BOTTLES DRAWN AEROBIC AND ANAEROBIC Blood Culture adequate volume ?Performed at [facility], [address], [place], [place] [address] ?  ? Culture PENDING   ? Report Status PENDING   ?CBC  ?Result Value Ref Range  ? WBC 8.3 4.0 - 10.5 K/uL  ? RBC 4.89 3.87 - 5.11 MIL/uL  ? Hemoglobin 14.5 12.0 - 15.0 g/dL  ? HCT 45.3 36.0 - 46.0 %  ? MCV 92.6 80.0 - 100.0 fL  ? MCH 29.7 26.0 - 34.0 pg  ? MCHC 32.0 30.0 - 36.0 g/dL  ? RDW 14.0 11.5 - 15.5 %  ? Platelets 211 150 - 400 K/uL  ? nRBC 0.0 0.0 - 0.2 %  ?Basic metabolic panel  ?Result Value Ref Range  ? Sodium 134 (L) 135 - 145 mmol/L  ? Potassium 4.2 3.5 - 5.1 mmol/L  ? Chloride 101 98 - 111 mmol/L  ? CO2 27 22 - 32 mmol/L  ? Glucose, Bld 235 (H) 70 - 99 mg/dL  ? BUN 19 8 - 23 mg/dL  ? Creatinine, Ser 1.08 (H) 0.44 - 1.00 mg/dL  ? Calcium 8.5 (L) 8.9 - 10.3 mg/dL  ? GFR, Estimated 49 (L) >60 mL/min  ? Anion gap 6 5 - 15  ?Urinalysis, Routine w reflex microscopic Urine, Clean Catch  ?Result Value Ref Range  ? Color, Urine YELLOW YELLOW  ? APPearance HAZY (A) CLEAR  ? Specific Gravity, Urine 1.017 1.005 - 1.030  ? pH 6.0 5.0 - 8.0  ? Glucose, UA 150 (A) NEGATIVE mg/dL  ? Hgb urine dipstick SMALL (A) NEGATIVE  ? Bilirubin Urine NEGATIVE NEGATIVE  ? Ketones, ur NEGATIVE NEGATIVE mg/dL  ? Protein, ur 100 (A) NEGATIVE mg/dL  ? Nitrite NEGATIVE NEGATIVE  ? Leukocytes,Ua NEGATIVE NEGATIVE  ? RBC / HPF 0-5 0 - 5 RBC/hpf  ? WBC, UA  0-5 0 - 5 WBC/hpf  ? Bacteria, UA RARE (A) NONE SEEN  ? Squamous Epithelial / LPF 11-20 0 - 5  ? Trans Epithel, UA <1   ? Mucus PRESENT   ?Protime-INR  ?Result Value Ref Range  ? Prothrombin Time 23.6 (H) 11.4 - 15.2

## 2021-10-13 NOTE — ED Notes (Signed)
Pt placed on 2L [place] O2. Pt O2 sat was 91% ?

## 2021-10-14 ENCOUNTER — Emergency Department (HOSPITAL_COMMUNITY): Payer: Medicare Other

## 2021-10-14 LAB — HEPATIC FUNCTION PANEL
ALT: 14 U/L (ref 0–44)
AST: 19 U/L (ref 15–41)
Albumin: 2.7 g/dL — ABNORMAL LOW (ref 3.5–5.0)
Alkaline Phosphatase: 82 U/L (ref 38–126)
Bilirubin, Direct: 0.1 mg/dL (ref 0.0–0.2)
Indirect Bilirubin: 0.3 mg/dL (ref 0.3–0.9)
Total Bilirubin: 0.4 mg/dL (ref 0.3–1.2)
Total Protein: 6.1 g/dL — ABNORMAL LOW (ref 6.5–8.1)

## 2021-10-14 MED ORDER — CEPHALEXIN 500 MG PO CAPS: 500.0000 mg | ORAL_CAPSULE | Freq: Three times a day (TID) | ORAL | 0 refills | Status: AC

## 2021-10-14 NOTE — ED Provider Notes (Signed)
Hand off from Dr Matilde Sprang to follow up on doppler study.  Doppler study negative.  Pt stable for dc back to nursing facility.  Tx for cellulitis ?  ?Dorie Rank, MD ?10/14/21 [phone] ? ?

## 2021-10-14 NOTE — ED Notes (Signed)
Patient transported to CT 

## 2021-10-14 NOTE — ED Notes (Signed)
Daughter at bedside to take pt home. Pt was 3 assist to bathroom and wheelchair.  ?

## 2021-10-14 NOTE — ED Notes (Signed)
Pt discharged home with daughter. Pt assisted with 3 staff members to get into car. Daughter verbalized understanding of prescriptions.  ?

## 2021-10-14 NOTE — ED Notes (Signed)
CSW spoke with pts daughter Xitlalic Maslin who states that she will not be having her mother return to [facility]. Jana Half states that she has been planning to not have her mother return. Jana Half states that she has already gotten a hospital bed and O2 set up in the home for her mother. Jana Half is currently at the facility getting her mothers belongings and will return to the hospital shortly. CSW updated pts RN of this information. TOC signing off.  ?

## 2021-10-14 NOTE — ED Notes (Signed)
Called [facility] regarding pt's medication from main pharmacy ?

## 2021-10-14 NOTE — ED Notes (Signed)
freestyle lybre on pt's right arm was removed  prior to CT ?

## 2021-10-18 ENCOUNTER — Ambulatory Visit (INDEPENDENT_AMBULATORY_CARE_PROVIDER_SITE_OTHER): Payer: Medicare Other

## 2021-10-18 DIAGNOSIS — I442 Atrioventricular block, complete: Secondary | ICD-10-CM | POA: Diagnosis not present

## 2021-10-18 LAB — CULTURE, BLOOD (ROUTINE X 2)
Culture: NO GROWTH
Culture: NO GROWTH
Special Requests: ADEQUATE

## 2021-10-19 LAB — CUP PACEART REMOTE DEVICE CHECK
Battery Remaining Longevity: 54 mo
Battery Remaining Percentage: 54 %
Battery Voltage: 2.99 V
Brady Statistic RV Percent Paced: 99 %
Date Time Interrogation Session: 20230412112810
Implantable Lead Implant Date: 20070829
Implantable Lead Location: 753860
Implantable Pulse Generator Implant Date: 20170418
Lead Channel Impedance Value: 580 Ohm
Lead Channel Pacing Threshold Amplitude: 1 V
Lead Channel Pacing Threshold Pulse Width: 0.9 ms
Lead Channel Sensing Intrinsic Amplitude: 12 mV
Lead Channel Setting Pacing Amplitude: 2.5 V
Lead Channel Setting Pacing Pulse Width: 0.9 ms
Lead Channel Setting Sensing Sensitivity: 4 mV
Pulse Gen Model: 1240
Pulse Gen Serial Number: 7891166

## 2021-11-03 NOTE — Progress Notes (Signed)
Remote pacemaker transmission.   

## 2022-01-17 ENCOUNTER — Ambulatory Visit (INDEPENDENT_AMBULATORY_CARE_PROVIDER_SITE_OTHER): Payer: Medicare Other

## 2022-01-17 DIAGNOSIS — I442 Atrioventricular block, complete: Secondary | ICD-10-CM

## 2022-01-19 LAB — CUP PACEART REMOTE DEVICE CHECK
Battery Remaining Longevity: 52 mo
Battery Remaining Percentage: 52 %
Battery Voltage: 2.99 V
Brady Statistic RV Percent Paced: 99 %
Date Time Interrogation Session: 20230712112545
Implantable Lead Implant Date: 20070829
Implantable Lead Location: 753860
Implantable Pulse Generator Implant Date: 20170418
Lead Channel Impedance Value: 580 Ohm
Lead Channel Pacing Threshold Amplitude: 1.25 V
Lead Channel Pacing Threshold Pulse Width: 0.9 ms
Lead Channel Sensing Intrinsic Amplitude: 11.9 mV
Lead Channel Setting Pacing Amplitude: 2.5 V
Lead Channel Setting Pacing Pulse Width: 0.9 ms
Lead Channel Setting Sensing Sensitivity: 4 mV
Pulse Gen Model: 1240
Pulse Gen Serial Number: 7891166

## 2022-02-09 NOTE — Progress Notes (Signed)
Remote pacemaker transmission.   

## 2022-02-28 NOTE — Progress Notes (Signed)
Remote reviewed. Battery status noted.  Leads function stable

## 2022-04-18 ENCOUNTER — Ambulatory Visit (INDEPENDENT_AMBULATORY_CARE_PROVIDER_SITE_OTHER): Payer: Medicare Other

## 2022-04-18 DIAGNOSIS — I442 Atrioventricular block, complete: Secondary | ICD-10-CM

## 2022-04-18 LAB — CUP PACEART REMOTE DEVICE CHECK
Battery Remaining Longevity: 49 mo
Battery Remaining Percentage: 49 %
Battery Voltage: 2.99 V
Brady Statistic RV Percent Paced: 99 %
Date Time Interrogation Session: 20231010020015
Implantable Lead Implant Date: 20070829
Implantable Lead Location: 753860
Implantable Pulse Generator Implant Date: 20170418
Lead Channel Impedance Value: 590 Ohm
Lead Channel Pacing Threshold Amplitude: 1.25 V
Lead Channel Pacing Threshold Pulse Width: 0.9 ms
Lead Channel Sensing Intrinsic Amplitude: 7.9 mV
Lead Channel Setting Pacing Amplitude: 2.5 V
Lead Channel Setting Pacing Pulse Width: 0.9 ms
Lead Channel Setting Sensing Sensitivity: 4 mV
Pulse Gen Model: 1240
Pulse Gen Serial Number: 7891166

## 2022-05-03 NOTE — Progress Notes (Signed)
Remote pacemaker transmission.   

## 2022-05-12 ENCOUNTER — Encounter: Payer: Medicare Other | Admitting: Internal Medicine

## 2022-07-18 ENCOUNTER — Ambulatory Visit (INDEPENDENT_AMBULATORY_CARE_PROVIDER_SITE_OTHER): Payer: Medicare Other

## 2022-07-18 DIAGNOSIS — I442 Atrioventricular block, complete: Secondary | ICD-10-CM

## 2022-07-18 LAB — CUP PACEART REMOTE DEVICE CHECK
Battery Remaining Longevity: 47 mo
Battery Remaining Percentage: 47 %
Battery Voltage: 2.98 V
Brady Statistic RV Percent Paced: 99 %
Date Time Interrogation Session: 20240109020012
Implantable Lead Connection Status: 753985
Implantable Lead Implant Date: 20070829
Implantable Lead Location: 753860
Implantable Pulse Generator Implant Date: 20170418
Lead Channel Impedance Value: 580 Ohm
Lead Channel Pacing Threshold Amplitude: 1.25 V
Lead Channel Pacing Threshold Pulse Width: 0.9 ms
Lead Channel Sensing Intrinsic Amplitude: 7 mV
Lead Channel Setting Pacing Amplitude: 2.5 V
Lead Channel Setting Pacing Pulse Width: 0.9 ms
Lead Channel Setting Sensing Sensitivity: 4 mV
Pulse Gen Model: 1240
Pulse Gen Serial Number: 7891166

## 2022-08-10 NOTE — Progress Notes (Signed)
Remote pacemaker transmission.   

## 2022-10-17 ENCOUNTER — Ambulatory Visit (INDEPENDENT_AMBULATORY_CARE_PROVIDER_SITE_OTHER): Payer: Medicare Other

## 2022-10-17 DIAGNOSIS — I442 Atrioventricular block, complete: Secondary | ICD-10-CM | POA: Diagnosis not present

## 2022-10-18 LAB — CUP PACEART REMOTE DEVICE CHECK
Battery Remaining Longevity: 44 mo
Battery Remaining Percentage: 45 %
Battery Voltage: 2.98 V
Brady Statistic RV Percent Paced: 99 %
Date Time Interrogation Session: 20240409020021
Implantable Lead Connection Status: 753985
Implantable Lead Implant Date: 20070829
Implantable Lead Location: 753860
Implantable Pulse Generator Implant Date: 20170418
Lead Channel Impedance Value: 580 Ohm
Lead Channel Pacing Threshold Amplitude: 1.25 V
Lead Channel Pacing Threshold Pulse Width: 0.9 ms
Lead Channel Sensing Intrinsic Amplitude: 11.9 mV
Lead Channel Setting Pacing Amplitude: 2.5 V
Lead Channel Setting Pacing Pulse Width: 0.9 ms
Lead Channel Setting Sensing Sensitivity: 4 mV
Pulse Gen Model: 1240
Pulse Gen Serial Number: 7891166

## 2022-11-24 NOTE — Progress Notes (Signed)
Remote pacemaker transmission.   

## 2023-01-16 ENCOUNTER — Ambulatory Visit (INDEPENDENT_AMBULATORY_CARE_PROVIDER_SITE_OTHER): Payer: Medicare Other

## 2023-01-16 DIAGNOSIS — I442 Atrioventricular block, complete: Secondary | ICD-10-CM

## 2023-01-16 LAB — CUP PACEART REMOTE DEVICE CHECK
Battery Remaining Longevity: 43 mo
Battery Remaining Percentage: 42 %
Battery Voltage: 2.98 V
Brady Statistic RV Percent Paced: 99 %
Date Time Interrogation Session: 20240709035604
Implantable Lead Connection Status: 753985
Implantable Lead Implant Date: 20070829
Implantable Lead Location: 753860
Implantable Pulse Generator Implant Date: 20170418
Lead Channel Impedance Value: 590 Ohm
Lead Channel Pacing Threshold Amplitude: 1.25 V
Lead Channel Pacing Threshold Pulse Width: 0.9 ms
Lead Channel Sensing Intrinsic Amplitude: 12 mV
Lead Channel Setting Pacing Amplitude: 2.5 V
Lead Channel Setting Pacing Pulse Width: 0.9 ms
Lead Channel Setting Sensing Sensitivity: 4 mV
Pulse Gen Model: 1240
Pulse Gen Serial Number: 7891166

## 2023-02-08 NOTE — Progress Notes (Signed)
Remote pacemaker transmission.   

## 2023-04-17 ENCOUNTER — Ambulatory Visit (INDEPENDENT_AMBULATORY_CARE_PROVIDER_SITE_OTHER): Payer: Medicare Other

## 2023-04-17 DIAGNOSIS — I4821 Permanent atrial fibrillation: Secondary | ICD-10-CM

## 2023-04-17 DIAGNOSIS — I442 Atrioventricular block, complete: Secondary | ICD-10-CM

## 2023-04-18 LAB — CUP PACEART REMOTE DEVICE CHECK
Battery Remaining Longevity: 40 mo
Battery Remaining Percentage: 40 %
Battery Voltage: 2.98 V
Brady Statistic RV Percent Paced: 99 %
Date Time Interrogation Session: 20241008024232
Implantable Lead Connection Status: 753985
Implantable Lead Implant Date: 20070829
Implantable Lead Location: 753860
Implantable Pulse Generator Implant Date: 20170418
Lead Channel Impedance Value: 580 Ohm
Lead Channel Pacing Threshold Amplitude: 1.25 V
Lead Channel Pacing Threshold Pulse Width: 0.9 ms
Lead Channel Sensing Intrinsic Amplitude: 9.9 mV
Lead Channel Setting Pacing Amplitude: 2.5 V
Lead Channel Setting Pacing Pulse Width: 0.9 ms
Lead Channel Setting Sensing Sensitivity: 4 mV
Pulse Gen Model: 1240
Pulse Gen Serial Number: 7891166

## 2023-05-07 NOTE — Progress Notes (Signed)
Remote pacemaker transmission.
